# Patient Record
Sex: Female | Born: 1955 | Race: White | Hispanic: No | Smoking: Never smoker
Health system: Southern US, Community
[De-identification: ages and names within clinical notes are randomized; demographics above are authoritative.]

## PROBLEM LIST (undated history)

## (undated) DIAGNOSIS — I10 Essential (primary) hypertension: Secondary | ICD-10-CM

## (undated) DIAGNOSIS — I219 Acute myocardial infarction, unspecified: Secondary | ICD-10-CM

## (undated) DIAGNOSIS — E1159 Type 2 diabetes mellitus with other circulatory complications: Secondary | ICD-10-CM

## (undated) HISTORY — PX: CHOLECYSTECTOMY: SHX55

## (undated) HISTORY — PX: CORONARY ANGIOPLASTY WITH STENT PLACEMENT: SHX49

## (undated) HISTORY — DX: Type 2 diabetes mellitus with other circulatory complications: E11.59

## (undated) HISTORY — PX: TUBAL LIGATION: SHX77

---

## 2004-12-12 ENCOUNTER — Other Ambulatory Visit: Admission: RE | Admit: 2004-12-12 | Discharge: 2004-12-12 | Payer: Self-pay | Admitting: Otolaryngology

## 2004-12-15 ENCOUNTER — Encounter: Admission: RE | Admit: 2004-12-15 | Discharge: 2004-12-15 | Payer: Self-pay | Admitting: Otolaryngology

## 2005-01-17 ENCOUNTER — Encounter: Admission: RE | Admit: 2005-01-17 | Discharge: 2005-01-17 | Payer: Self-pay | Admitting: Otolaryngology

## 2005-01-22 ENCOUNTER — Ambulatory Visit (HOSPITAL_COMMUNITY): Admission: RE | Admit: 2005-01-22 | Discharge: 2005-01-22 | Payer: Self-pay | Admitting: Otolaryngology

## 2005-01-22 ENCOUNTER — Ambulatory Visit (HOSPITAL_BASED_OUTPATIENT_CLINIC_OR_DEPARTMENT_OTHER): Admission: RE | Admit: 2005-01-22 | Discharge: 2005-01-22 | Payer: Self-pay | Admitting: Otolaryngology

## 2005-03-29 ENCOUNTER — Encounter: Admission: RE | Admit: 2005-03-29 | Discharge: 2005-03-29 | Payer: Self-pay | Admitting: Otolaryngology

## 2005-04-02 ENCOUNTER — Ambulatory Visit (HOSPITAL_COMMUNITY): Admission: RE | Admit: 2005-04-02 | Discharge: 2005-04-02 | Payer: Self-pay | Admitting: Otolaryngology

## 2005-04-02 ENCOUNTER — Ambulatory Visit (HOSPITAL_BASED_OUTPATIENT_CLINIC_OR_DEPARTMENT_OTHER): Admission: RE | Admit: 2005-04-02 | Discharge: 2005-04-02 | Payer: Self-pay | Admitting: Otolaryngology

## 2016-07-27 ENCOUNTER — Emergency Department (HOSPITAL_COMMUNITY): Payer: 59

## 2016-07-27 ENCOUNTER — Encounter (HOSPITAL_COMMUNITY): Payer: Self-pay

## 2016-07-27 ENCOUNTER — Emergency Department (HOSPITAL_COMMUNITY)
Admission: EM | Admit: 2016-07-27 | Discharge: 2016-07-27 | Disposition: A | Discharge status: Home / Self Care | Payer: 59 | Attending: Emergency Medicine | Admitting: Emergency Medicine

## 2016-07-27 DIAGNOSIS — Z91040 Latex allergy status: Secondary | ICD-10-CM | POA: Insufficient documentation

## 2016-07-27 DIAGNOSIS — I252 Old myocardial infarction: Secondary | ICD-10-CM | POA: Insufficient documentation

## 2016-07-27 DIAGNOSIS — I10 Essential (primary) hypertension: Secondary | ICD-10-CM | POA: Diagnosis not present

## 2016-07-27 DIAGNOSIS — R109 Unspecified abdominal pain: Secondary | ICD-10-CM

## 2016-07-27 DIAGNOSIS — Z955 Presence of coronary angioplasty implant and graft: Secondary | ICD-10-CM | POA: Diagnosis not present

## 2016-07-27 DIAGNOSIS — E119 Type 2 diabetes mellitus without complications: Secondary | ICD-10-CM | POA: Diagnosis not present

## 2016-07-27 DIAGNOSIS — N200 Calculus of kidney: Secondary | ICD-10-CM | POA: Diagnosis not present

## 2016-07-27 HISTORY — DX: Acute myocardial infarction, unspecified: I21.9

## 2016-07-27 HISTORY — DX: Essential (primary) hypertension: I10

## 2016-07-27 LAB — BASIC METABOLIC PANEL
ANION GAP: 11 (ref 5–15)
BUN: 18 mg/dL (ref 6–20)
CALCIUM: 10.2 mg/dL (ref 8.9–10.3)
CHLORIDE: 104 mmol/L (ref 101–111)
CO2: 23 mmol/L (ref 22–32)
CREATININE: 1 mg/dL (ref 0.44–1.00)
GFR calc non Af Amer: >60 mL/min (ref >60)
GLUCOSE: 328 mg/dL — AB (ref 65–99)
POTASSIUM: 4.7 mmol/L (ref 3.5–5.1)
SODIUM: 138 mmol/L (ref 135–145)

## 2016-07-27 LAB — URINALYSIS, ROUTINE W REFLEX MICROSCOPIC
BILIRUBIN URINE: NEGATIVE
Ketones, ur: NEGATIVE mg/dL
Leukocytes, UA: NEGATIVE
Nitrite: NEGATIVE
PROTEIN: NEGATIVE mg/dL
SPECIFIC GRAVITY, URINE: 1.034 — AB (ref 1.005–1.030)
pH: 5 (ref 5.0–8.0)

## 2016-07-27 LAB — CBC WITH DIFFERENTIAL/PLATELET
BASOS ABS: 0 10*3/uL (ref 0.0–0.1)
Basophils Relative: 0 %
EOS ABS: 0.1 10*3/uL (ref 0.0–0.7)
Eosinophils Relative: 2 %
HCT: 37.9 % (ref 36.0–46.0)
HEMOGLOBIN: 12.7 g/dL (ref 12.0–15.0)
LYMPHS ABS: 2.1 10*3/uL (ref 0.7–4.0)
Lymphocytes Relative: 41 %
MCH: 30 pg (ref 26.0–34.0)
MCHC: 33.5 g/dL (ref 30.0–36.0)
MCV: 89.4 fL (ref 78.0–100.0)
Monocytes Absolute: 0.2 10*3/uL (ref 0.1–1.0)
Monocytes Relative: 3 %
NEUTROS PCT: 54 %
Neutro Abs: 2.7 10*3/uL (ref 1.7–7.7)
PLATELETS: 297 10*3/uL (ref 150–400)
RBC: 4.24 MIL/uL (ref 3.87–5.11)
RDW: 12.8 % (ref 11.5–15.5)
WBC: 5 10*3/uL (ref 4.0–10.5)

## 2016-07-27 LAB — URINE MICROSCOPIC-ADD ON

## 2016-07-27 LAB — CBG MONITORING, ED: GLUCOSE-CAPILLARY: 315 mg/dL — AB (ref 65–99)

## 2016-07-27 MED ORDER — HYDROMORPHONE HCL 1 MG/ML IJ SOLN
1.0000 mg | Freq: Once | INTRAMUSCULAR | Status: AC
  Administered 2016-07-27: 1 mg via INTRAVENOUS
  Filled 2016-07-27: qty 1

## 2016-07-27 MED ORDER — HYDROCODONE-ACETAMINOPHEN 5-325 MG PO TABS: 1.0000 | ORAL_TABLET | Freq: Four times a day (QID) | ORAL | 0 refills | Status: DC | PRN

## 2016-07-27 MED ORDER — FENTANYL CITRATE (PF) 100 MCG/2ML IJ SOLN: 50.0000 ug | INTRAMUSCULAR | Status: DC | PRN

## 2016-07-27 MED ORDER — ONDANSETRON HCL 4 MG/2ML IJ SOLN
4.0000 mg | Freq: Once | INTRAMUSCULAR | Status: AC
  Administered 2016-07-27: 4 mg via INTRAVENOUS
  Filled 2016-07-27: qty 2

## 2016-07-27 NOTE — ED Notes (Signed)
Pt is aware of high blood pressure. Pt has not been able to take meds due to emesis.

## 2016-07-27 NOTE — ED Notes (Signed)
Patient transported to X-ray 

## 2016-07-27 NOTE — ED Notes (Signed)
Patient transported to CT 

## 2016-07-27 NOTE — ED Notes (Signed)
Return from xray

## 2016-07-27 NOTE — ED Provider Notes (Signed)
[facility]-EMERGENCY DEPT Provider Note   CSN: [id number] Arrival date & time: 07/27/16  0945     History   Chief Complaint Chief Complaint  Patient presents with  . Flank Pain  . Shortness of Breath    HPI Alyssa Fox is a 60 y.o. female.  Patient with past medical history of diabetes, hypertension, prior MI presents to the emergency department with chief complaint of right flank pain. She states pain started suddenly at around 7:30 this morning.  She states the pain was followed by some nausea, vomiting, diaphoresis. She states the pain takes her breath away. She denies any chest pain. She denies any numbness, weakness, or tingling of her extremities.  She has not tried taking anything for her symptoms. She denies any dysuria or hematuria.   The history is provided by the patient. No language interpreter was used.    Past Medical History:  Diagnosis Date  . Diabetes mellitus without complication (HCC)   . Hypertension   . MI (myocardial infarction) (HCC)     There are no active problems to display for this patient.   Past Surgical History:  Procedure Laterality Date  . CHOLECYSTECTOMY    . CORONARY ANGIOPLASTY WITH STENT PLACEMENT    . TUBAL LIGATION      OB History    No data available       Home Medications    Prior to Admission medications   Not on File    Family History No family history on file.  Social History Social History  Substance Use Topics  . Smoking status: Never Smoker  . Smokeless tobacco: Never Used  . Alcohol use No     Allergies   Latex   Review of Systems Review of Systems  Constitutional: Positive for diaphoresis.  Gastrointestinal: Positive for nausea.  Genitourinary: Positive for flank pain.  All other systems reviewed and are negative.    Physical Exam Updated Vital Signs BP (!) 174/125 (BP Location: Right Arm)   Pulse (!) 56   Temp 97.3 F (36.3 C) (Oral)   Resp 22   Wt 110.2 kg   SpO2 99%   Physical  Exam  Constitutional: She is oriented to person, place, and time. She appears well-developed and well-nourished.  HENT:  Head: Normocephalic and atraumatic.  Eyes: Conjunctivae and EOM are normal. Pupils are equal, round, and reactive to light.  Neck: Normal range of motion. Neck supple.  Cardiovascular: Normal rate, regular rhythm and intact distal pulses.  Exam reveals no gallop and no friction rub.   No murmur heard. Intact distal pulses bilaterally  Pulmonary/Chest: Effort normal and breath sounds normal. No respiratory distress. She has no wheezes. She has no rales. She exhibits no tenderness.  Abdominal: Soft. Bowel sounds are normal. She exhibits no distension and no mass. There is no tenderness. There is no rebound and no guarding.  Right-sided CVA tenderness No focal abdominal tenderness, no RLQ tenderness or pain at McBurney's point, no RUQ tenderness or Murphy's sign, no left-sided abdominal tenderness, no fluid wave, or signs of peritonitis   Musculoskeletal: Normal range of motion. She exhibits no edema or tenderness.  Neurological: She is alert and oriented to person, place, and time.  Moves all extremities Sensation and strength intact throughout  Skin: Skin is warm and dry.  Psychiatric: She has a normal mood and affect. Her behavior is normal. Judgment and thought content normal.  Nursing note and vitals reviewed.    ED Treatments / Results  Labs (  all labs ordered are listed, but only abnormal results are displayed) Labs Reviewed  URINALYSIS, ROUTINE W REFLEX MICROSCOPIC (NOT AT [facility]) - Abnormal; Notable for the following:       Result Value   Specific Gravity, Urine 1.034 (*)    Glucose, UA >1000 (*)    Hgb urine dipstick SMALL (*)    All other components within normal limits  URINE MICROSCOPIC-ADD ON - Abnormal; Notable for the following:    Squamous Epithelial / LPF 0-5 (*)    Bacteria, UA FEW (*)    Crystals URIC ACID CRYSTALS (*)    All other components  within normal limits  CBG MONITORING, ED - Abnormal; Notable for the following:    Glucose-Capillary 315 (*)    All other components within normal limits    EKG  EKG Interpretation None       Radiology No results found.  Procedures Procedures (including critical care time)  Medications Ordered in ED Medications  fentaNYL (SUBLIMAZE) injection 50 mcg (not administered)  HYDROmorphone (DILAUDID) injection 1 mg (not administered)     Initial Impression / Assessment and Plan / ED Course  I have reviewed the triage vital signs and the nursing notes.  Pertinent labs & imaging results that were available during my care of the patient were reviewed by me and considered in my medical decision making (see chart for details).  Clinical Course    Patient with sudden onset right flank pain. Associated nausea, vomiting, and diaphoresis. She does have some blood in her urine. Suspicious of kidney stone. Will check CT renal stone study. She is neurovascularly intact. Will treat pain and will reassess.  CT findings are consistent with recently passed stone. Her urine is also consistent with stone. I did discuss with the patient there is possibility though unlikely, that there is a stone not visualized on the CT scan. I will prescribe person pain medicine and give her urine strainer. Her pain is adequately controlled now. She feels comfortable with going home. Will recommend primary care follow-up. Return precautions have been given. She is stable and ready for discharge.  Final Clinical Impressions(s) / ED Diagnoses   Final diagnoses:  Flank pain  Kidney stone    New Prescriptions New Prescriptions   HYDROCODONE-ACETAMINOPHEN (NORCO/VICODIN) 5-325 MG TABLET    Take 1-2 tablets by mouth every 6 (six) hours as needed.     Roxy HorsemanRobert Mahogony Gilchrest, PA-C 07/27/16 1321    Nira ConnPedro Eduardo Cardama, MD 07/28/16 1329

## 2016-07-27 NOTE — ED Triage Notes (Signed)
Per Pt, Pt is coming from home with complaints of right lower back pain that started around 0730. Followed by SOB, Diaphoresis, and nausea. Pt denies Hx of the same. Alert and oriented x4. Denies Chest pain.

## 2017-09-08 IMAGING — CT CT RENAL STONE PROTOCOL
2 of 4 series · 16 of 46 positions shown, 18 images · non-contrast
Comparison: None.

CLINICAL DATA: Right flank and low back pain with nausea beginning
this morning.

EXAM:
CT ABDOMEN AND PELVIS WITHOUT CONTRAST
TECHNIQUE: Multidetector CT imaging of the abdomen and pelvis was performed
following the standard protocol without IV contrast.

[Series 2: renal stone 5mm · axial · 0.98mm/px · z∈[-524,-59]mm · 13 of 103 slices shown, 15 images]
[im 5/103  soft-tissue]
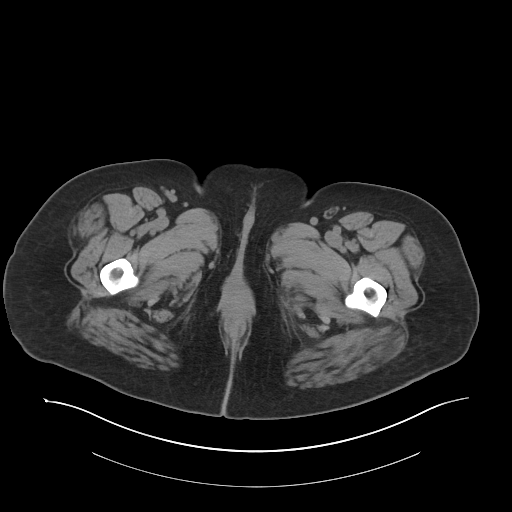
[im 5/103  bone]
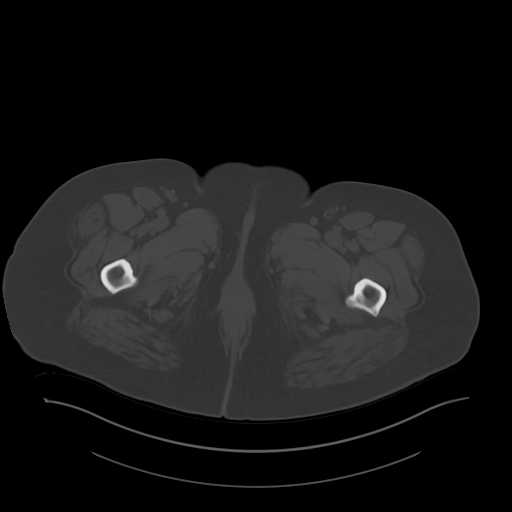
[im 14/103  soft-tissue]
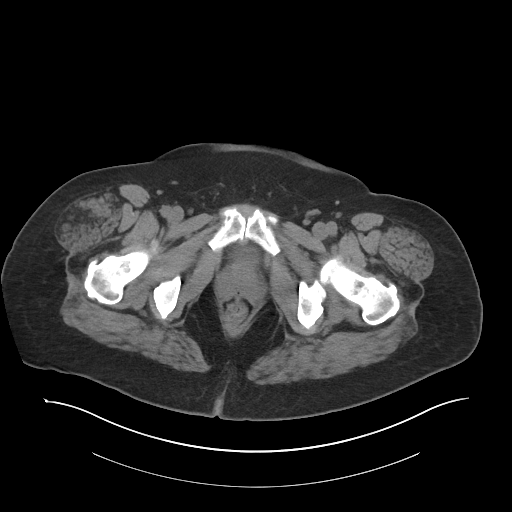
[im 23/103  soft-tissue]
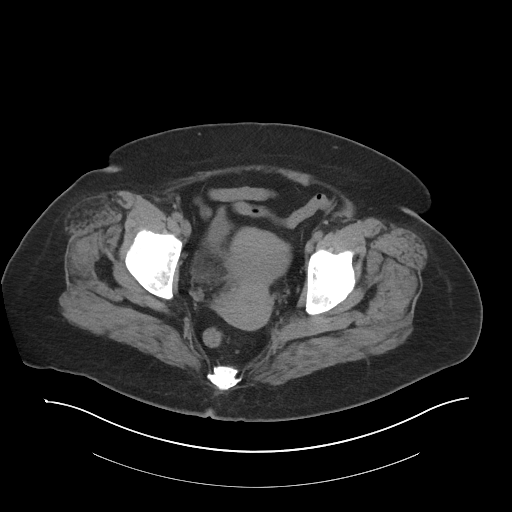
[im 27/103  soft-tissue]
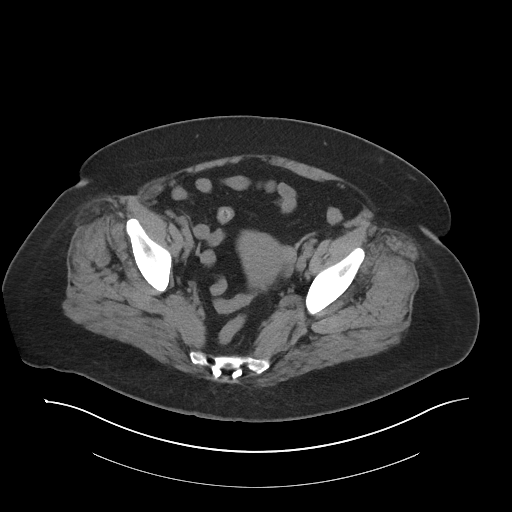
[im 36/103  soft-tissue]
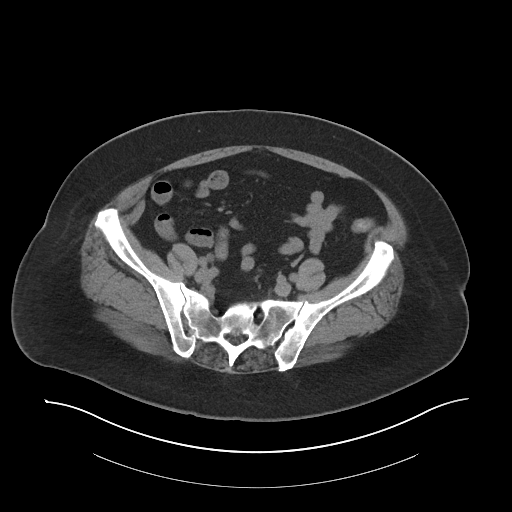
[im 45/103  soft-tissue]
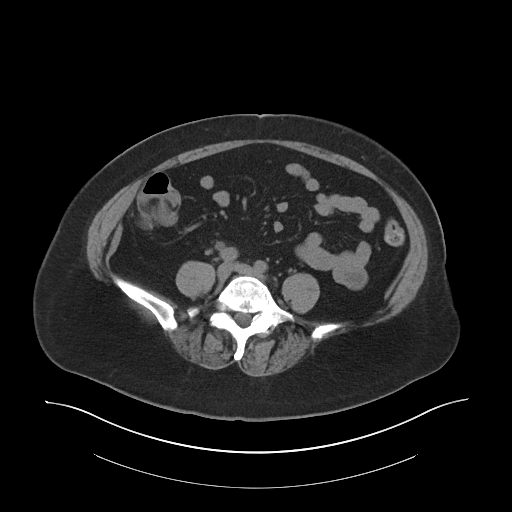
[im 54/103  soft-tissue]
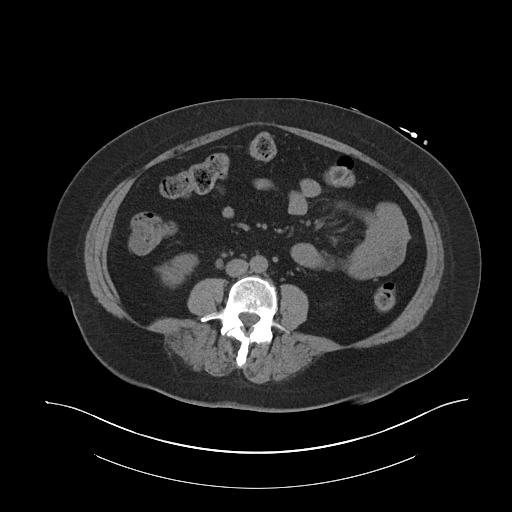
[im 58/103  soft-tissue]
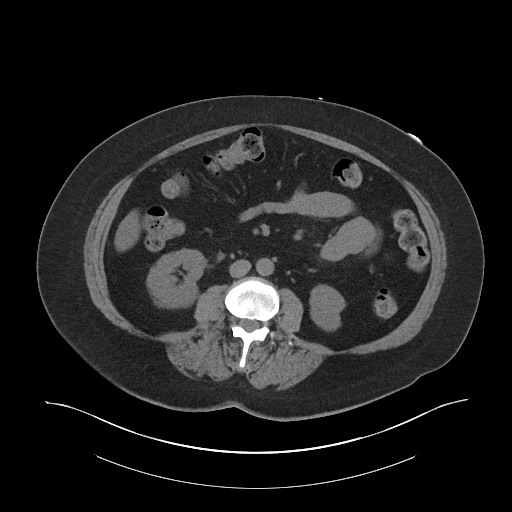
[im 67/103  soft-tissue]
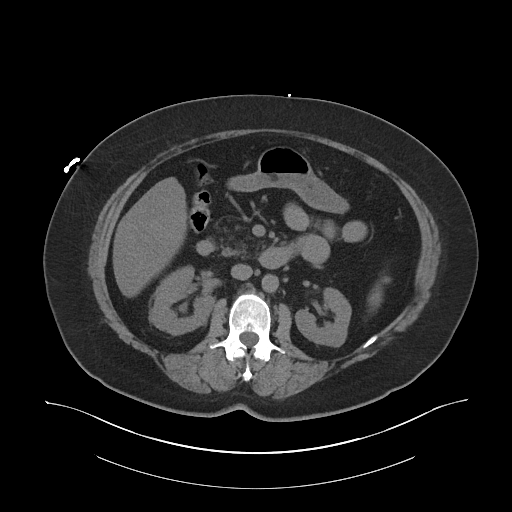
[im 67/103  bone]
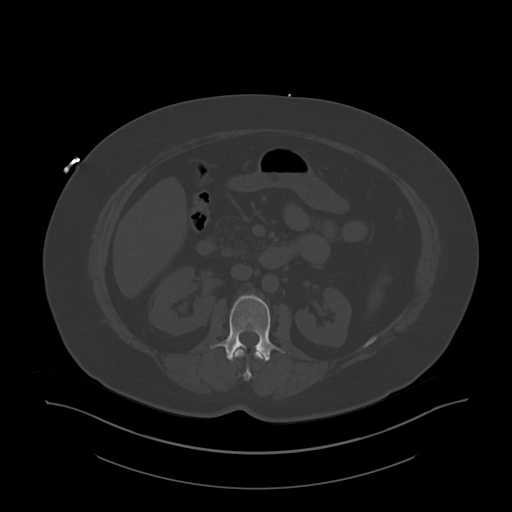
[im 76/103  soft-tissue]
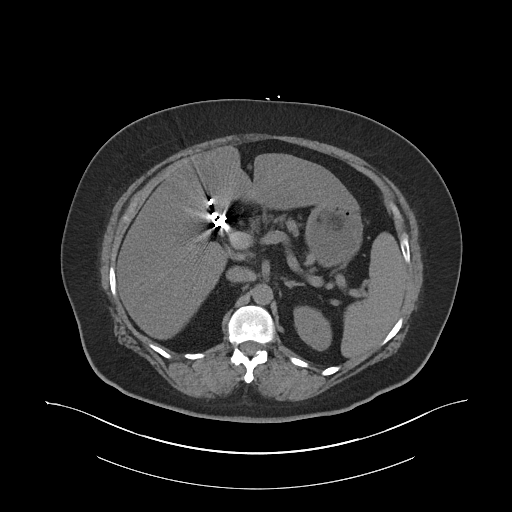
[im 80/103  soft-tissue]
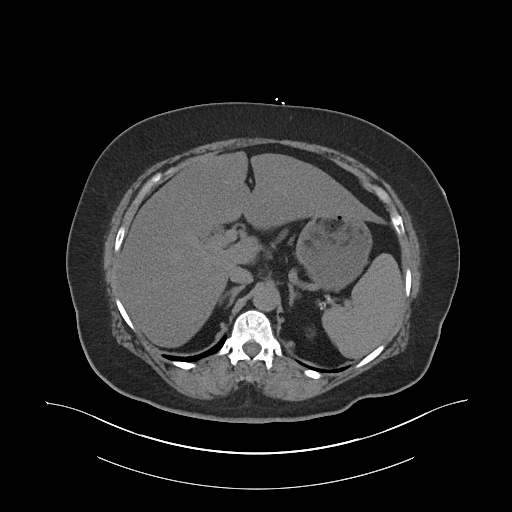
[im 89/103  soft-tissue]
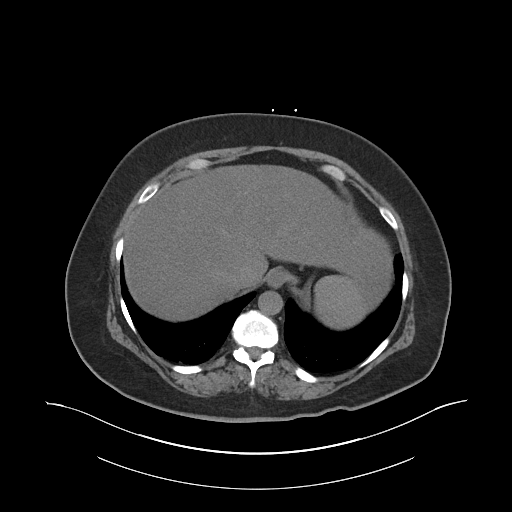
[im 98/103  soft-tissue]
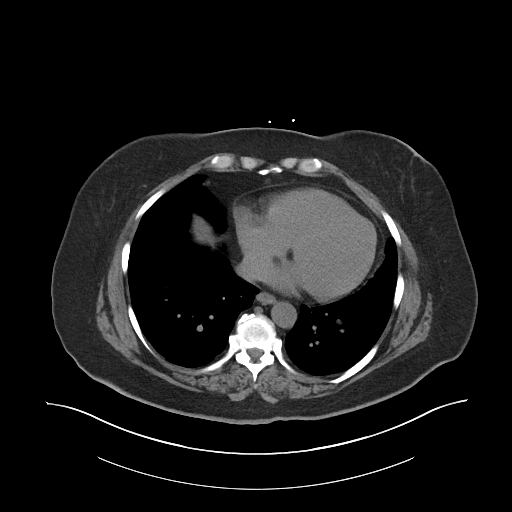

[Series 5: renal stone 3.0 cor · coronal · 0.84mm/px · 3 of 108 slices shown]
[im 36/108  soft-tissue]
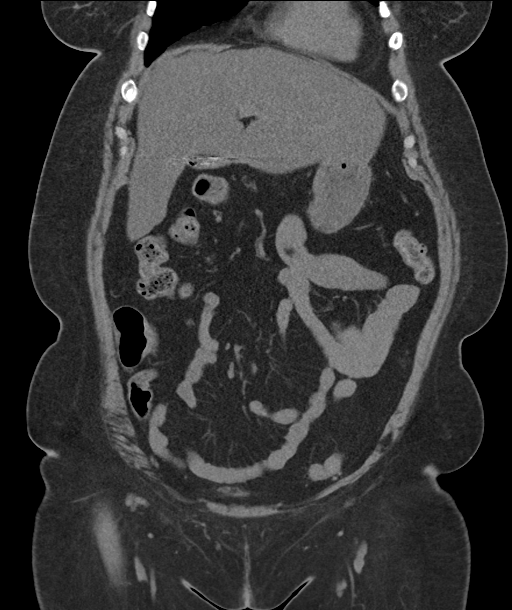
[im 48/108  soft-tissue]
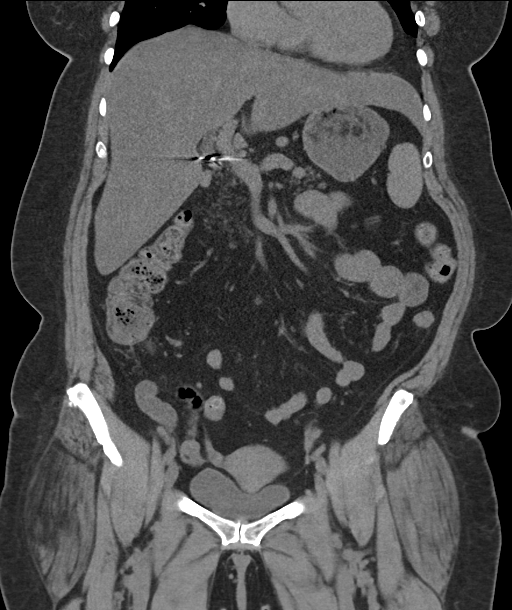
[im 60/108  soft-tissue]
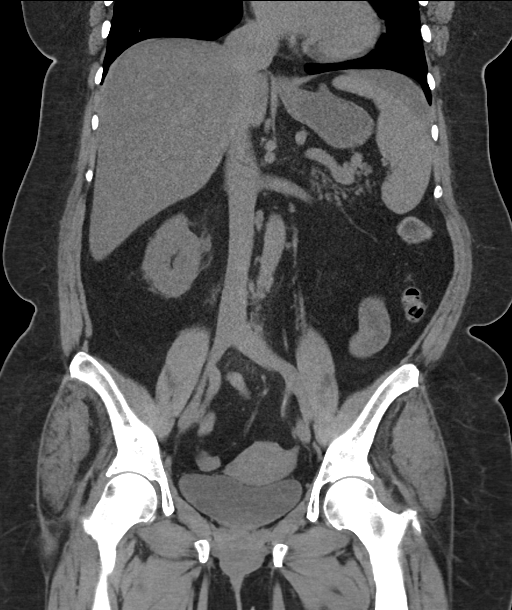

[16 of 46 positions shown; findings below may reference images not displayed]

FINDINGS: Lower chest: Lung bases show mild scarring. There is probably left
coronary artery calcifications seen on the initial image.

Hepatobiliary: No focal liver lesion. Insignificant few scattered
calcified granulomas. Previous cholecystectomy. There may be mild
diffuse fatty change of the liver.

Pancreas: Fatty change of the pancreas without focal lesion.

Spleen: Normal

Adrenals/Urinary Tract: Normal adrenal glands. Left kidney is normal
without cyst, mass, stone or hydronephrosis. Right kidney shows mild
swelling. Nonobstructing 2 mm stone in the midportion. Mild fullness
of the renal collecting system an ureter. No stone seen presently
the within the ureter or within the bladder. There is a vascular
calcification on image 75 that is lateral to the ureter.

Stomach/Bowel: Normal.  Appendix is normal.

Vascular/Lymphatic: Atherosclerosis of the aorta. IVC is normal. No
retroperitoneal mass or lymphadenopathy.

Reproductive: Uterus and adnexal regions appear normal.

Other: None.

Musculoskeletal:  Degenerative disc disease L5-S1.
IMPRESSION: Mild fullness of the right renal collecting system and ureter, but
no visible ureteral stone or bladder stone at this time. Perhaps
this stone has passed.

2 mm nonobstructing stone in the midportion of the right kidney.

Mild fatty change of the liver.

Aortic atherosclerosis.

## 2018-01-31 ENCOUNTER — Encounter: Payer: Self-pay | Admitting: "Endocrinology

## 2018-03-18 LAB — HEMOGLOBIN A1C: Hemoglobin A1C: 9

## 2018-04-17 ENCOUNTER — Ambulatory Visit (INDEPENDENT_AMBULATORY_CARE_PROVIDER_SITE_OTHER): Payer: 59 | Admitting: "Endocrinology

## 2018-04-17 ENCOUNTER — Encounter: Payer: Self-pay | Admitting: "Endocrinology

## 2018-04-17 VITALS — BP 136/74 | HR 80 | Ht 65.0 in | Wt 240.0 lb

## 2018-04-17 DIAGNOSIS — E1122 Type 2 diabetes mellitus with diabetic chronic kidney disease: Secondary | ICD-10-CM | POA: Diagnosis not present

## 2018-04-17 DIAGNOSIS — Z794 Long term (current) use of insulin: Secondary | ICD-10-CM

## 2018-04-17 DIAGNOSIS — E1159 Type 2 diabetes mellitus with other circulatory complications: Secondary | ICD-10-CM

## 2018-04-17 DIAGNOSIS — N183 Chronic kidney disease, stage 3 unspecified: Secondary | ICD-10-CM

## 2018-04-17 DIAGNOSIS — I10 Essential (primary) hypertension: Secondary | ICD-10-CM

## 2018-04-17 DIAGNOSIS — E782 Mixed hyperlipidemia: Secondary | ICD-10-CM | POA: Diagnosis not present

## 2018-04-17 DIAGNOSIS — I219 Acute myocardial infarction, unspecified: Secondary | ICD-10-CM | POA: Diagnosis not present

## 2018-04-17 NOTE — Progress Notes (Signed)
Endocrinology Consult Note       04/17/2018, 5:00 PM   Subjective:    [name], female    DOB: 03-10-1956.  Alyssa Fox is being seen in consultation for management of currently uncontrolled symptomatic diabetes requested by  Jonetta Speak, MD.   Past Medical History:  Diagnosis Date  . Diabetes mellitus without complication (HCC)   . Hypertension   . MI (myocardial infarction) [facility])    Past Surgical History:  Procedure Laterality Date  . CHOLECYSTECTOMY    . CORONARY ANGIOPLASTY WITH STENT PLACEMENT    . TUBAL LIGATION     Social History   Socioeconomic History  . Marital status: Married    Spouse name: Not on file  . Number of children: Not on file  . Years of education: Not on file  . Highest education level: Not on file  Occupational History  . Not on file  Social Needs  . Financial resource strain: Not on file  . Food insecurity:    Worry: Not on file    Inability: Not on file  . Transportation needs:    Medical: Not on file    Non-medical: Not on file  Tobacco Use  . Smoking status: Never Smoker  . Smokeless tobacco: Never Used  Substance and Sexual Activity  . Alcohol use: No  . Drug use: No  . Sexual activity: Not on file  Lifestyle  . Physical activity:    Days per week: Not on file    Minutes per session: Not on file  . Stress: Not on file  Relationships  . Social connections:    Talks on phone: Not on file    Gets together: Not on file    Attends religious service: Not on file    Active member of club or organization: Not on file    Attends meetings of clubs or organizations: Not on file    Relationship status: Not on file  Other Topics Concern  . Not on file  Social History Narrative  . Not on file   Outpatient Encounter Medications as of 04/17/2018  Medication Sig  . allopurinol (ZYLOPRIM) 100 MG tablet every morning.  Marland Kitchen  aspirin EC 81 MG tablet Take by mouth daily.  Marland Kitchen atorvastatin (LIPITOR) 40 MG tablet Take 40 mg by mouth daily.  Marland Kitchen gabapentin (NEURONTIN) 100 MG capsule Take 100 mg by mouth 3 (three) times daily.  . Insulin Glargine (BASAGLAR KWIKPEN) 100 UNIT/ML SOPN Inject 40 Units into the skin at bedtime.  . liraglutide (VICTOZA) 18 MG/3ML SOPN Inject 1.8 mg into the skin at bedtime.  Marland Kitchen lisinopril (PRINIVIL,ZESTRIL) 5 MG tablet Take 5 mg by mouth daily.  . meloxicam (MOBIC) 15 MG tablet Take 15 mg by mouth daily.  . metFORMIN (GLUCOPHAGE-XR) 750 MG 24 hr tablet Take 750 mg by mouth daily with breakfast.  . nebivolol (BYSTOLIC) 5 MG tablet Take 5 mg by mouth daily.  . nitroGLYCERIN (NITROSTAT) 0.4 MG SL tablet Place 0.4 mg under the tongue every 5 (five) minutes as needed for chest pain.  . ticagrelor (BRILINTA) 90 MG TABS tablet Take by  mouth 2 (two) times daily.  . [DISCONTINUED] fenofibrate 54 MG tablet Take 54 mg by mouth daily.  . [DISCONTINUED] HYDROcodone-acetaminophen (NORCO/VICODIN) 5-325 MG tablet Take 1-2 tablets by mouth every 6 (six) hours as needed.  . [DISCONTINUED] metFORMIN (GLUCOPHAGE) 500 MG tablet Take 500 mg by mouth 2 (two) times daily with a meal.  . [DISCONTINUED] nebivolol (BYSTOLIC) 5 MG tablet Take 5 mg by mouth daily.  . [DISCONTINUED] ticagrelor (BRILINTA) 90 MG TABS tablet Take 90 mg by mouth 2 (two) times daily.   No facility-administered encounter medications on file as of 04/17/2018.     ALLERGIES: Allergies  Allergen Reactions  . Latex     VACCINATION STATUS:  There is no immunization history on file for this patient.  Diabetes  She presents for her initial diabetic visit. She has type 2 diabetes mellitus. Onset time: She was diagnosed at approximate age of 55 years. Her disease course has been worsening. There are no hypoglycemic associated symptoms. Pertinent negatives for hypoglycemia include no confusion, headaches, pallor or seizures. There are no diabetic  associated symptoms. Pertinent negatives for diabetes include no chest pain, no polydipsia, no polyphagia and no polyuria. There are no hypoglycemic complications. Symptoms are improving. Diabetic complications include heart disease. (She is status post 3 stent placement for coronary artery disease and December 2015-about the time of her diagnosis with diabetes.) Risk factors for coronary artery disease include dyslipidemia, diabetes mellitus, family history, obesity, hypertension, post-menopausal and sedentary lifestyle. Current diabetic treatment includes insulin injections and oral agent (monotherapy) (She is currently on Basaglar 40 units nightly, Victoza 1.2 mg daily, and metformin 750 mg p.o. daily.). Her weight is fluctuating minimally. She is following a generally unhealthy diet. When asked about meal planning, she reported none. She has not had a previous visit with a dietitian. She rarely participates in exercise. (She did not bring any meter nor logs to review today.  She reports that her recent A1c was 9% improving from 10+ percent.) An ACE inhibitor/angiotensin II receptor blocker is being taken. Eye exam is current.  Hyperlipidemia  This is a chronic problem. The current episode started more than 1 year ago. Exacerbating diseases include diabetes and obesity. Pertinent negatives include no chest pain, myalgias or shortness of breath. Current antihyperlipidemic treatment includes statins. Risk factors for coronary artery disease include hypertension, dyslipidemia, a sedentary lifestyle, post-menopausal, diabetes mellitus and obesity.  Hypertension  This is a chronic problem. The current episode started more than 1 year ago. Pertinent negatives include no chest pain, headaches, palpitations or shortness of breath. Risk factors for coronary artery disease include dyslipidemia, diabetes mellitus, family history, post-menopausal state, obesity and sedentary lifestyle. Past treatments include ACE  inhibitors.      Review of Systems  Constitutional: Negative for chills, fever and unexpected weight change.  HENT: Negative for trouble swallowing and voice change.   Eyes: Negative for visual disturbance.  Respiratory: Negative for cough, shortness of breath and wheezing.   Cardiovascular: Negative for chest pain, palpitations and leg swelling.  Gastrointestinal: Negative for diarrhea, nausea and vomiting.  Endocrine: Negative for cold intolerance, heat intolerance, polydipsia, polyphagia and polyuria.  Musculoskeletal: Negative for arthralgias and myalgias.  Skin: Negative for color change, pallor, rash and wound.  Neurological: Negative for seizures and headaches.  Psychiatric/Behavioral: Negative for confusion and suicidal ideas.    Objective:    BP 136/74   Pulse 80   Ht 5\' 5"  (1.651 m)   Wt 240 lb (108.9 kg)   BMI 39.94  kg/m   Wt Readings from Last 3 Encounters:  04/17/18 240 lb (108.9 kg)  07/27/16 243 lb (110.2 kg)     Physical Exam  Constitutional: She is oriented to person, place, and time. She appears well-developed.  HENT:  Head: Normocephalic and atraumatic.  Eyes: EOM are normal.  Neck: Normal range of motion. Neck supple. No tracheal deviation present. No thyromegaly present.  Cardiovascular: Normal rate and regular rhythm.  Pulmonary/Chest: Effort normal and breath sounds normal.  Abdominal: Soft. Bowel sounds are normal. There is no tenderness. There is no guarding.  Musculoskeletal: Normal range of motion. She exhibits no edema.  Neurological: She is alert and oriented to person, place, and time. She has normal reflexes. No cranial nerve deficit. Coordination normal.  Skin: Skin is warm and dry. No rash noted. No erythema. No pallor.  Psychiatric: She has a normal mood and affect. Judgment normal.    CMP     Component Value Date/Time   NA 138 07/27/2016 1005   K 4.7 07/27/2016 1005   CL 104 07/27/2016 1005   CO2 23 07/27/2016 1005   GLUCOSE 328  (H) 07/27/2016 1005   BUN 18 07/27/2016 1005   CREATININE 1.00 07/27/2016 1005   CALCIUM 10.2 07/27/2016 1005   GFRNONAA >60 07/27/2016 1005   GFRAA >60 07/27/2016 1005   April 2019 she reports A1c was 9%.      Assessment & Plan:   1. Type 2 diabetes mellitus with stage 3 chronic kidney disease, with long-term current use of insulin (HCC)   - Alyssa Fox has currently uncontrolled symptomatic type 2 DM since 62 years of age,  with most recent reported A1c of 9 %. Recent labs reviewed, stage 3 renal insufficiency.  -her diabetes is complicated by coronary artery disease which required stent placement in 2015, renal insufficiency, obesity/sedentary life and Alyssa Fox remains at a high risk for more acute and chronic complications which include CAD, CVA, CKD, retinopathy, and neuropathy. These are all discussed in detail with the patient.  - I have counseled her on diet management and weight loss, by adopting a carbohydrate restricted/protein rich diet.  - Suggestion is made for her to avoid simple carbohydrates  from her diet including Cakes, Sweet Desserts, Ice Cream, Soda (diet and regular), Sweet Tea, Candies, Chips, Cookies, Store Bought Juices, Alcohol in Excess of  1-2 drinks a day, Artificial Sweeteners, and "Sugar-free" Products. This will help patient to have stable blood glucose profile and potentially avoid unintended weight gain.  - I encouraged her to switch to  unprocessed or minimally processed complex starch and increased protein intake (animal or plant source), fruits, and vegetables.  - she is advised to stick to a routine mealtimes to eat 3 meals  a day and avoid unnecessary snacks ( to snack only to correct hypoglycemia).   - she will be scheduled with Alyssa Fox, RDN, CDE for individualized diabetes education.  - I have approached her with the following individualized plan to manage diabetes and patient agrees:   -Based on her current glycemic  burden, she may require intensive treatment with basal/bolus insulin.   -In preparation, I approach her to start monitoring blood glucose 4 times a day-before meals and at bedtime and return in 1 week with her meter and logs for reevaluation.   -In the meantime, I advised her to continue Basaglar 40 units nightly, increase Victoza to 1.8 mg of cutaneously daily, continue metformin 750 mg ER daily after breakfast.   -  Patient is warned not to take insulin without proper monitoring per orders.  -Patient is encouraged to call clinic for blood glucose levels less than 70 or above 300 mg /dl.  -Patient is not a candidate for SGLT2 inhibitors, nor full dose of metformin due to CKD.  - she will be considered for incretin therapy as appropriate next visit. - Patient specific target  A1c;  LDL, HDL, Triglycerides, and  Waist Circumference were discussed in detail.  2) BP/HTN: Her blood pressure is controlled to target.  She is advised to continue her current blood pressure medications including lisinopril 5 mg p.o. daily.   3) Lipids/HPL:   He does not have recent lipid panel to review.  She is advised to continue atorvastatin 40 mg p.o. nightly.  4)  Weight/Diet: CDE Consult will be initiated , exercise, and detailed carbohydrates information provided.  She may benefit from weight loss surgery, which is briefly discussed with her.  This will be explored on subsequent visits.  5) Chronic Care/Health Maintenance:  -she  is on ACEI/ARB and Statin medications and  is encouraged to continue to follow up with Ophthalmology, Dentist,  Podiatrist at least yearly or according to recommendations, and advised to  stay away from smoking. I have recommended yearly flu vaccine and pneumonia vaccination at least every 5 years; moderate intensity exercise for up to 150 minutes weekly; and  sleep for at least 7 hours a day.  - I advised patient to maintain close follow up with Jonetta Speak, MD for primary care  needs.  - Time spent with the patient: 45 minutes, of which >50% was spent in obtaining information about her symptoms, reviewing her previous labs, evaluations, and treatments, counseling her about her currently uncontrolled, complicated type 2 diabetes; obesity, hypertension, hyperlipidemia, and developing a plan to confirm the diagnosis and long term treatment as necessary.  Alyssa Fox participated in the discussions, expressed understanding, and voiced agreement with the above plans.  All questions were answered to her satisfaction. she is encouraged to contact clinic should she have any questions or concerns prior to her return visit.  Follow up plan: - Return in about 1 week (around 04/24/2018) for follow up with meter and logs- no labs.  Alyssa Lunch, MD [facility] [address] [place], [place] [address] Phone: [phone]  Fax: [phone]    04/17/2018, 5:00 PM  This note was partially dictated with voice recognition software. Similar sounding words can be transcribed inadequately or may not  be corrected upon review.

## 2018-04-17 NOTE — Patient Instructions (Signed)

## 2018-04-30 ENCOUNTER — Ambulatory Visit (INDEPENDENT_AMBULATORY_CARE_PROVIDER_SITE_OTHER): Payer: 59 | Admitting: "Endocrinology

## 2018-04-30 ENCOUNTER — Encounter: Payer: Self-pay | Admitting: "Endocrinology

## 2018-04-30 VITALS — BP 117/77 | HR 89 | Ht 65.0 in | Wt 242.0 lb

## 2018-04-30 DIAGNOSIS — Z794 Long term (current) use of insulin: Secondary | ICD-10-CM

## 2018-04-30 DIAGNOSIS — E782 Mixed hyperlipidemia: Secondary | ICD-10-CM

## 2018-04-30 DIAGNOSIS — E1122 Type 2 diabetes mellitus with diabetic chronic kidney disease: Secondary | ICD-10-CM | POA: Diagnosis not present

## 2018-04-30 DIAGNOSIS — N183 Chronic kidney disease, stage 3 unspecified: Secondary | ICD-10-CM

## 2018-04-30 DIAGNOSIS — I10 Essential (primary) hypertension: Secondary | ICD-10-CM

## 2018-04-30 NOTE — Patient Instructions (Signed)

## 2018-04-30 NOTE — Progress Notes (Signed)
Endocrinology Consult Note       04/30/2018, 5:50 PM   Subjective:    [name], female    DOB: 01/12/1956.  Alyssa Fox is being seen in consultation for management of currently uncontrolled symptomatic diabetes requested by  Jonetta Speak, MD.   Past Medical History:  Diagnosis Date  . DM type 2 causing vascular disease (HCC)   . Hypertension   . MI (myocardial infarction) [facility])    Past Surgical History:  Procedure Laterality Date  . CHOLECYSTECTOMY    . CORONARY ANGIOPLASTY WITH STENT PLACEMENT    . TUBAL LIGATION     Social History   Socioeconomic History  . Marital status: Married    Spouse name: Not on file  . Number of children: Not on file  . Years of education: Not on file  . Highest education level: Not on file  Occupational History  . Not on file  Social Needs  . Financial resource strain: Not on file  . Food insecurity:    Worry: Not on file    Inability: Not on file  . Transportation needs:    Medical: Not on file    Non-medical: Not on file  Tobacco Use  . Smoking status: Never Smoker  . Smokeless tobacco: Never Used  Substance and Sexual Activity  . Alcohol use: No  . Drug use: No  . Sexual activity: Not on file  Lifestyle  . Physical activity:    Days per week: Not on file    Minutes per session: Not on file  . Stress: Not on file  Relationships  . Social connections:    Talks on phone: Not on file    Gets together: Not on file    Attends religious service: Not on file    Active member of club or organization: Not on file    Attends meetings of clubs or organizations: Not on file    Relationship status: Not on file  Other Topics Concern  . Not on file  Social History Narrative  . Not on file   Outpatient Encounter Medications as of 04/30/2018  Medication Sig  . allopurinol (ZYLOPRIM) 100 MG tablet every morning.  Marland Kitchen aspirin EC  81 MG tablet Take by mouth daily.  Marland Kitchen atorvastatin (LIPITOR) 40 MG tablet Take 40 mg by mouth daily.  Marland Kitchen gabapentin (NEURONTIN) 100 MG capsule Take 100 mg by mouth 3 (three) times daily.  . Insulin Glargine (BASAGLAR KWIKPEN) 100 UNIT/ML SOPN Inject 60 Units into the skin at bedtime.  . liraglutide (VICTOZA) 18 MG/3ML SOPN Inject 1.8 mg into the skin at bedtime.  Marland Kitchen lisinopril (PRINIVIL,ZESTRIL) 5 MG tablet Take 5 mg by mouth daily.  . meloxicam (MOBIC) 15 MG tablet Take 15 mg by mouth daily.  . metFORMIN (GLUCOPHAGE-XR) 750 MG 24 hr tablet Take 750 mg by mouth daily with breakfast.  . nebivolol (BYSTOLIC) 5 MG tablet Take 5 mg by mouth daily.  . nitroGLYCERIN (NITROSTAT) 0.4 MG SL tablet Place 0.4 mg under the tongue every 5 (five) minutes as needed for chest pain.  . ticagrelor (BRILINTA) 90 MG TABS tablet  Take by mouth 2 (two) times daily.   No facility-administered encounter medications on file as of 04/30/2018.     ALLERGIES: Allergies  Allergen Reactions  . Latex     VACCINATION STATUS:  There is no immunization history on file for this patient.  Diabetes  She presents for her follow-up diabetic visit. She has type 2 diabetes mellitus. Onset time: She was diagnosed at approximate age of 29 years. Her disease course has been worsening. There are no hypoglycemic associated symptoms. Pertinent negatives for hypoglycemia include no confusion, headaches, pallor or seizures. Associated symptoms include polydipsia and polyuria. Pertinent negatives for diabetes include no chest pain and no polyphagia. There are no hypoglycemic complications. Symptoms are worsening. Diabetic complications include heart disease. (She is status post 3 stent placement for coronary artery disease and December 2015-about the time of her diagnosis with diabetes.) Risk factors for coronary artery disease include dyslipidemia, diabetes mellitus, family history, obesity, hypertension, post-menopausal and sedentary  lifestyle. Current diabetic treatment includes insulin injections and oral agent (monotherapy) (She is currently on Basaglar 40 units nightly, Victoza 1.2 mg daily, and metformin 750 mg p.o. daily.). Her weight is fluctuating minimally. She is following a generally unhealthy diet. When asked about meal planning, she reported none. She has not had a previous visit with a dietitian. She rarely participates in exercise. Her breakfast blood glucose range is generally >200 mg/dl. Her Fox blood glucose range is generally >200 mg/dl. Her dinner blood glucose range is generally >200 mg/dl. Her bedtime blood glucose range is generally >200 mg/dl. Her overall blood glucose range is >200 mg/dl. (She came with significantly above target blood glucose profile both fasting and postprandially.  She reports that her recent A1c was 9% improving from 10+ percent.) An ACE inhibitor/angiotensin II receptor blocker is being taken. Eye exam is current.  Hyperlipidemia  This is a chronic problem. The current episode started more than 1 year ago. Exacerbating diseases include diabetes and obesity. Pertinent negatives include no chest pain, myalgias or shortness of breath. Current antihyperlipidemic treatment includes statins. Risk factors for coronary artery disease include hypertension, dyslipidemia, a sedentary lifestyle, post-menopausal, diabetes mellitus and obesity.  Hypertension  This is a chronic problem. The current episode started more than 1 year ago. Pertinent negatives include no chest pain, headaches, palpitations or shortness of breath. Risk factors for coronary artery disease include dyslipidemia, diabetes mellitus, family history, post-menopausal state, obesity and sedentary lifestyle. Past treatments include ACE inhibitors.      Review of Systems  Constitutional: Negative for chills, fever and unexpected weight change.  HENT: Negative for trouble swallowing and voice change.   Eyes: Negative for visual  disturbance.  Respiratory: Negative for cough, shortness of breath and wheezing.   Cardiovascular: Negative for chest pain, palpitations and leg swelling.  Gastrointestinal: Negative for diarrhea, nausea and vomiting.  Endocrine: Positive for polydipsia and polyuria. Negative for cold intolerance, heat intolerance and polyphagia.  Musculoskeletal: Negative for arthralgias and myalgias.  Skin: Negative for color change, pallor, rash and wound.  Neurological: Negative for seizures and headaches.  Psychiatric/Behavioral: Negative for confusion and suicidal ideas.    Objective:    BP 117/77   Pulse 89   Ht 5\' 5"  (1.651 m)   Wt 242 lb (109.8 kg)   BMI 40.27 kg/m   Wt Readings from Last 3 Encounters:  04/30/18 242 lb (109.8 kg)  04/17/18 240 lb (108.9 kg)  07/27/16 243 lb (110.2 kg)     Physical Exam  Constitutional: She is  oriented to person, place, and time. She appears well-developed.  HENT:  Head: Normocephalic and atraumatic.  Eyes: EOM are normal.  Neck: Normal range of motion. Neck supple. No tracheal deviation present. No thyromegaly present.  Cardiovascular: Normal rate.  Pulmonary/Chest: Effort normal.  Abdominal: There is no tenderness. There is no guarding.  Musculoskeletal: Normal range of motion. She exhibits no edema.  Neurological: She is alert and oriented to person, place, and time. She has normal reflexes. No cranial nerve deficit. Coordination normal.  Skin: Skin is warm and dry. No rash noted. No erythema. No pallor.  Psychiatric: She has a normal mood and affect. Judgment normal.    CMP     Component Value Date/Time   NA 138 07/27/2016 1005   K 4.7 07/27/2016 1005   CL 104 07/27/2016 1005   CO2 23 07/27/2016 1005   GLUCOSE 328 (H) 07/27/2016 1005   BUN 18 07/27/2016 1005   CREATININE 1.00 07/27/2016 1005   CALCIUM 10.2 07/27/2016 1005   GFRNONAA >60 07/27/2016 1005   GFRAA >60 07/27/2016 1005   April 2019 she reports A1c was 9%.       Assessment & Plan:   1. Type 2 diabetes mellitus with stage 3 chronic kidney disease, with long-term current use of insulin (HCC)   - Alyssa Fox has currently uncontrolled symptomatic type 2 DM since 62 years of age. -She came with consistent, however significantly above target glycemic profile both fasting and postprandial.  Her A1c prior to her last visit was 9%.   - Recent labs reviewed, stage 3 renal insufficiency.  -her diabetes is complicated by coronary artery disease which required stent placement in 2015, renal insufficiency, obesity/sedentary life and Alyssa Fox remains at a high risk for more acute and chronic complications which include CAD, CVA, CKD, retinopathy, and neuropathy. These are all discussed in detail with the patient.  - I have counseled her on diet management and weight loss, by adopting a carbohydrate restricted/protein rich diet.  -  Suggestion is made for her to avoid simple carbohydrates  from her diet including Cakes, Sweet Desserts / Pastries, Ice Cream, Soda (diet and regular), Sweet Tea, Candies, Chips, Cookies, Store Bought Juices, Alcohol in Excess of  1-2 drinks a day, Artificial Sweeteners, and "Sugar-free" Products. This will help patient to have stable blood glucose profile and potentially avoid unintended weight gain.   - I encouraged her to switch to  unprocessed or minimally processed complex starch and increased protein intake (animal or plant source), fruits, and vegetables.  - she is advised to stick to a routine mealtimes to eat 3 meals  a day and avoid unnecessary snacks ( to snack only to correct hypoglycemia).   - she will be scheduled with Alyssa Fox, RDN, CDE for individualized diabetes education.  - I have approached her with the following individualized plan to manage diabetes and patient agrees:   -Based on her current glycemic burden, she may require intensive treatment with basal/bolus insulin.   -She has some  room on her basal insulin before escalation.   -I discussed and increased her Basaglar to 60 units nightly, associated with strict monitoring of blood glucose 2 times a day-before breakfast and at bedtime until her next visit in 8 weeks.    - I advised her to continue  Victoza  1.8 mg of cutaneously daily, continue metformin 750 mg ER daily after breakfast.   - Patient is warned not to take insulin without proper monitoring per  orders.  -Patient is encouraged to call clinic for blood glucose levels less than 70 or above 300 mg /dl.  -Patient is not a candidate for SGLT2 inhibitors, nor full dose of metformin due to CKD.  - Patient specific target  A1c;  LDL, HDL, Triglycerides, and  Waist Circumference were discussed in detail.  2) BP/HTN: Her blood pressure is controlled to target.  She is advised to continue her current blood pressure medications including lisinopril 5 mg p.o. daily.   3) Lipids/HPL:   she does not have recent lipid panel to review.  She is advised to continue atorvastatin 40 mg p.o. nightly.   4)  Weight/Diet: CDE Consult will be initiated , exercise, and detailed carbohydrates information provided.  She may benefit from weight loss surgery, which is briefly discussed with her.  This will be explored on subsequent visits.  5) Chronic Care/Health Maintenance:  -she  is on ACEI/ARB and Statin medications and  is encouraged to continue to follow up with Ophthalmology, Dentist,  Podiatrist at least yearly or according to recommendations, and advised to  stay away from smoking. I have recommended yearly flu vaccine and pneumonia vaccination at least every 5 years; moderate intensity exercise for up to 150 minutes weekly; and  sleep for at least 7 hours a day.  - I advised patient to maintain close follow up with Jonetta Speak, MD for primary care needs.  - Time spent with the patient: 25 min, of which >50% was spent in reviewing her blood glucose logs , discussing her hypo-  and hyper-glycemic episodes, reviewing her current and  previous labs and insulin doses and developing a plan to avoid hypo- and hyper-glycemia. Please refer to Patient Instructions for Blood Glucose Monitoring and Insulin/Medications Dosing Guide"  in media tab for additional information. Alyssa Fox participated in the discussions, expressed understanding, and voiced agreement with the above plans.  All questions were answered to her satisfaction. she is encouraged to contact clinic should she have any questions or concerns prior to her return visit.   Follow up plan: - Return in about 8 weeks (around 06/25/2018) for meter, and logs.  Alyssa Lunch, MD [facility] [address] [place], [place] [address] Phone: [phone]  Fax: [phone]    04/30/2018, 5:50 PM  This note was partially dictated with voice recognition software. Similar sounding words can be transcribed inadequately or may not  be corrected upon review.

## 2018-06-25 ENCOUNTER — Ambulatory Visit: Payer: 59 | Admitting: "Endocrinology

## 2018-06-26 ENCOUNTER — Encounter: Payer: 59 | Attending: Nephrology | Admitting: Nutrition

## 2018-06-26 VITALS — Ht 64.0 in | Wt 239.0 lb

## 2018-06-26 DIAGNOSIS — Z6841 Body Mass Index (BMI) 40.0 and over, adult: Secondary | ICD-10-CM | POA: Diagnosis not present

## 2018-06-26 DIAGNOSIS — Z794 Long term (current) use of insulin: Secondary | ICD-10-CM | POA: Diagnosis not present

## 2018-06-26 DIAGNOSIS — IMO0002 Reserved for concepts with insufficient information to code with codable children: Secondary | ICD-10-CM

## 2018-06-26 DIAGNOSIS — E1122 Type 2 diabetes mellitus with diabetic chronic kidney disease: Secondary | ICD-10-CM | POA: Insufficient documentation

## 2018-06-26 DIAGNOSIS — E1165 Type 2 diabetes mellitus with hyperglycemia: Secondary | ICD-10-CM

## 2018-06-26 DIAGNOSIS — E118 Type 2 diabetes mellitus with unspecified complications: Secondary | ICD-10-CM

## 2018-06-26 DIAGNOSIS — Z713 Dietary counseling and surveillance: Secondary | ICD-10-CM | POA: Diagnosis present

## 2018-06-26 DIAGNOSIS — N183 Chronic kidney disease, stage 3 (moderate): Secondary | ICD-10-CM | POA: Insufficient documentation

## 2018-06-26 DIAGNOSIS — E669 Obesity, unspecified: Secondary | ICD-10-CM

## 2018-06-26 LAB — COMPLETE METABOLIC PANEL WITH GFR
AG Ratio: 1.4 (calc) (ref 1.0–2.5)
ALKALINE PHOSPHATASE (APISO): 116 U/L (ref 33–130)
ALT: 19 U/L (ref 6–29)
AST: 17 U/L (ref 10–35)
Albumin: 4.2 g/dL (ref 3.6–5.1)
BUN/Creatinine Ratio: 16 (calc) (ref 6–22)
BUN: 19 mg/dL (ref 7–25)
CALCIUM: 9.9 mg/dL (ref 8.6–10.4)
CO2: 29 mmol/L (ref 20–32)
CREATININE: 1.19 mg/dL — AB (ref 0.50–0.99)
Chloride: 104 mmol/L (ref 98–110)
GFR, EST NON AFRICAN AMERICAN: 49 mL/min/{1.73_m2} — AB (ref >=60)
GFR, Est African American: 57 mL/min/{1.73_m2} — ABNORMAL LOW (ref >=60)
Globulin: 3.1 g/dL (calc) (ref 1.9–3.7)
Glucose, Bld: 112 mg/dL (ref 65–139)
POTASSIUM: 4.3 mmol/L (ref 3.5–5.3)
SODIUM: 139 mmol/L (ref 135–146)
Total Bilirubin: 0.9 mg/dL (ref 0.2–1.2)
Total Protein: 7.3 g/dL (ref 6.1–8.1)

## 2018-06-26 LAB — T4, FREE: FREE T4: 1.1 ng/dL (ref 0.8–1.8)

## 2018-06-26 LAB — HEMOGLOBIN A1C
EAG (MMOL/L): 9 (calc)
HEMOGLOBIN A1C: 7.3 %{Hb} — AB (ref <5.7)
MEAN PLASMA GLUCOSE: 163 (calc)

## 2018-06-26 LAB — VITAMIN D 25 HYDROXY (VIT D DEFICIENCY, FRACTURES): VIT D 25 HYDROXY: 30 ng/mL (ref 30–100)

## 2018-06-26 LAB — MICROALBUMIN / CREATININE URINE RATIO
Creatinine, Urine: 86 mg/dL (ref 20–275)
MICROALB/CREAT RATIO: 8 ug/mg{creat} (ref <30)
Microalb, Ur: 0.7 mg/dL

## 2018-06-26 LAB — TSH: TSH: 1.56 mIU/L (ref 0.40–4.50)

## 2018-06-26 NOTE — Patient Instructions (Signed)
Goals 1/ Follow MY Plate 2. Eat 2-3 carb choces per meal 3.  Increase fresh fruits and vegetables. 4. Drink 6-7 bottles of water per day 5. Increase exercise Lose 1-2 lbs per week Aim for BS 80-130 in am and Less than 150 before bed. Try to eat before 7 pm.

## 2018-06-26 NOTE — Progress Notes (Signed)
  Medical Nutrition Therapy:  Appt start time: 1530 end time:  1630.   Assessment:  Primary concerns today: Diabetes Type 2. Lives by herself. Sees Dr. Fransico Him, endocrinology.  Metformin 750 mg ER daily.  And Victoza.  Basaglar 60 units.  Eats 3 meals per day PHysical actviity: rides exercise bike some. Doesn't like to walk outdoors due to dogs. A1C 7.3%. She notes her A1C usually 6% and recently just "jumped up." Testing BID. 122- 175 mg/dl 488-891 mg/dl. Changes Recently made: Not eating late at night, riding exercise bike 10 minutes a day, cut out snacks and drinking water. Wants to  Lose weight. BMI > 40. Engaged to make changes to improve her DM and be healthier.  Lab Results  Component Value Date   HGBA1C 7.3 (H) 06/25/2018     CMP Latest Ref Rng & Units 06/25/2018 07/27/2016  Glucose 65 - 139 mg/dL 694 503(U)  BUN 7 - 25 mg/dL 19 18  Creatinine 8.82 - 0.99 mg/dL 8.00(L) 4.91  Sodium 791 - 146 mmol/L 139 138  Potassium 3.5 - 5.3 mmol/L 4.3 4.7  Chloride 98 - 110 mmol/L 104 104  CO2 20 - 32 mmol/L 29 23  Calcium 8.6 - 10.4 mg/dL 9.9 50.5  Total Protein 6.1 - 8.1 g/dL 7.3 -  Total Bilirubin 0.2 - 1.2 mg/dL 0.9 -  AST 10 - 35 U/L 17 -  ALT 6 - 29 U/L 19 -   Wt Readings from Last 3 Encounters:  06/26/18 239 lb (108.4 kg)  04/30/18 242 lb (109.8 kg)  04/17/18 240 lb (108.9 kg)   Ht Readings from Last 3 Encounters:  06/26/18 5\' 4"  (1.626 m)  04/30/18 5\' 5"  (1.651 m)  04/17/18 5\' 5"  (1.651 m)   Body mass index is 41.02 kg/m.  Preferred Learning Style:   No preference indicated   Learning Readiness:    Ready  Change in progress   MEDICATIONS:   DIETARY INTAKE:  B Mini bagels 1, coffee L)hamburger l/t/m with bun, water D) Chicken, baked brussel sprouds and mashed potatoes and water  Beverages: water  Usual physical activity: rides exercise bike 10 minutes a day   Estimated energy needs: 1200-1500  calories 158  g carbohydrates 90  g protein 33 g  fat  Progress Towards Goal(s):  In progress.   Nutritional Diagnosis:  NB-1.1 Food and nutrition-related knowledge deficit As related to Diabetes.  As evidenced by A1C 7.3.    Intervention Nutrition and Diabetes education provided on My Plate, CHO counting, meal planning, portion sizes, timing of meals, avoiding snacks between meals unless having a low blood sugar, target ranges for A1C and blood sugars, signs/symptoms and treatment of hyper/hypoglycemia, monitoring blood sugars, taking medications as prescribed, benefits of exercising 30 minutes per day and prevention of complications of DM.  Goals 1/ Follow MY Plate 2. Eat 2-3 carb choces per meal 3.  Increase fresh fruits and vegetables. 4. Drink 6-7 bottles of water per day 5. Increase exercise Lose 1-2 lbs per week Aim for BS 80-130 in am and Less than 150 before bed. Try to eat before 7 pm.   Teaching Method Utilized:  Visual Auditory Hands on  Handouts given during visit include:  The Plate Method   Meal Plan Card   Barriers to learning/adherence to lifestyle change: none  Demonstrated degree of understanding via:  Teach Back   Monitoring/Evaluation:  Dietary intake, exercise, meal planning , and body weight in 1 month(s).

## 2018-07-02 ENCOUNTER — Encounter: Payer: Self-pay | Admitting: Nutrition

## 2018-07-10 ENCOUNTER — Ambulatory Visit: Payer: 59 | Admitting: Nutrition

## 2018-07-10 ENCOUNTER — Ambulatory Visit: Payer: 59 | Admitting: "Endocrinology

## 2018-07-14 ENCOUNTER — Other Ambulatory Visit: Payer: Self-pay

## 2018-07-14 MED ORDER — BASAGLAR KWIKPEN 100 UNIT/ML ~~LOC~~ SOPN: 60.0000 [IU] | PEN_INJECTOR | Freq: Every day | SUBCUTANEOUS | 1 refills | Status: DC

## 2018-07-29 ENCOUNTER — Ambulatory Visit: Payer: 59 | Admitting: "Endocrinology

## 2018-08-27 ENCOUNTER — Ambulatory Visit (INDEPENDENT_AMBULATORY_CARE_PROVIDER_SITE_OTHER): Payer: 59 | Admitting: "Endocrinology

## 2018-08-27 ENCOUNTER — Encounter: Payer: Self-pay | Admitting: "Endocrinology

## 2018-08-27 VITALS — BP 126/82 | HR 81 | Ht 65.0 in | Wt 245.0 lb

## 2018-08-27 DIAGNOSIS — I10 Essential (primary) hypertension: Secondary | ICD-10-CM

## 2018-08-27 DIAGNOSIS — E1122 Type 2 diabetes mellitus with diabetic chronic kidney disease: Secondary | ICD-10-CM

## 2018-08-27 DIAGNOSIS — Z794 Long term (current) use of insulin: Secondary | ICD-10-CM

## 2018-08-27 DIAGNOSIS — N183 Chronic kidney disease, stage 3 (moderate): Secondary | ICD-10-CM

## 2018-08-27 DIAGNOSIS — E782 Mixed hyperlipidemia: Secondary | ICD-10-CM

## 2018-08-27 MED ORDER — BASAGLAR KWIKPEN 100 UNIT/ML ~~LOC~~ SOPN: 70.0000 [IU] | PEN_INJECTOR | Freq: Every day | SUBCUTANEOUS | 1 refills | Status: DC

## 2018-08-27 NOTE — Progress Notes (Signed)
Endocrinology follow-up  Note       08/27/2018, 5:20 PM   Subjective:    [name], female    DOB: 06-02-1956.  Alyssa Fox is being seen in consultation for management of currently uncontrolled symptomatic 2 diabetes, hyperlipidemia, hypertension, obesity. PMD:   Jonetta Speak, MD.   Past Medical History:  Diagnosis Date  . DM type 2 causing vascular disease (HCC)   . Hypertension   . MI (myocardial infarction) [facility])    Past Surgical History:  Procedure Laterality Date  . CHOLECYSTECTOMY    . CORONARY ANGIOPLASTY WITH STENT PLACEMENT    . TUBAL LIGATION     Social History   Socioeconomic History  . Marital status: Married    Spouse name: Not on file  . Number of children: Not on file  . Years of education: Not on file  . Highest education level: Not on file  Occupational History  . Not on file  Social Needs  . Financial resource strain: Not on file  . Food insecurity:    Worry: Not on file    Inability: Not on file  . Transportation needs:    Medical: Not on file    Non-medical: Not on file  Tobacco Use  . Smoking status: Never Smoker  . Smokeless tobacco: Never Used  Substance and Sexual Activity  . Alcohol use: No  . Drug use: No  . Sexual activity: Not on file  Lifestyle  . Physical activity:    Days per week: Not on file    Minutes per session: Not on file  . Stress: Not on file  Relationships  . Social connections:    Talks on phone: Not on file    Gets together: Not on file    Attends religious service: Not on file    Active member of club or organization: Not on file    Attends meetings of clubs or organizations: Not on file    Relationship status: Not on file  Other Topics Concern  . Not on file  Social History Narrative  . Not on file   Outpatient Encounter Medications as of 08/27/2018  Medication Sig  . allopurinol (ZYLOPRIM) 100  MG tablet every morning.  Marland Kitchen aspirin EC 81 MG tablet Take by mouth daily.  Marland Kitchen atorvastatin (LIPITOR) 40 MG tablet Take 40 mg by mouth daily.  Marland Kitchen gabapentin (NEURONTIN) 100 MG capsule Take 100 mg by mouth 3 (three) times daily.  . Insulin Glargine (BASAGLAR KWIKPEN) 100 UNIT/ML SOPN Inject 0.7 mLs (70 Units total) into the skin at bedtime.  . liraglutide (VICTOZA) 18 MG/3ML SOPN Inject 1.8 mg into the skin at bedtime.  Marland Kitchen lisinopril (PRINIVIL,ZESTRIL) 5 MG tablet Take 5 mg by mouth daily.  . meloxicam (MOBIC) 15 MG tablet Take 15 mg by mouth daily.  . metFORMIN (GLUCOPHAGE-XR) 750 MG 24 hr tablet Take 750 mg by mouth daily with breakfast.  . nebivolol (BYSTOLIC) 5 MG tablet Take 5 mg by mouth daily.  . nitroGLYCERIN (NITROSTAT) 0.4 MG SL tablet Place 0.4 mg under the tongue every 5 (five) minutes as needed for chest pain.  Marland Kitchen  ticagrelor (BRILINTA) 90 MG TABS tablet Take by mouth 2 (two) times daily.  . [DISCONTINUED] Insulin Glargine (BASAGLAR KWIKPEN) 100 UNIT/ML SOPN Inject 0.6 mLs (60 Units total) into the skin at bedtime.   No facility-administered encounter medications on file as of 08/27/2018.     ALLERGIES: Allergies  Allergen Reactions  . Latex     VACCINATION STATUS:  There is no immunization history on file for this patient.  Diabetes  She presents for her follow-up diabetic visit. She has type 2 diabetes mellitus. Onset time: She was diagnosed at approximate age of 7 years. Her disease course has been improving. There are no hypoglycemic associated symptoms. Pertinent negatives for hypoglycemia include no confusion, headaches, pallor or seizures. Associated symptoms include polydipsia and polyuria. Pertinent negatives for diabetes include no chest pain and no polyphagia. There are no hypoglycemic complications. Symptoms are improving. Diabetic complications include heart disease. (She is status post 3 stent placement for coronary artery disease and December 2015-about the time of her  diagnosis with diabetes.) Risk factors for coronary artery disease include dyslipidemia, diabetes mellitus, family history, obesity, hypertension, post-menopausal and sedentary lifestyle. Current diabetic treatment includes insulin injections and oral agent (monotherapy) (She is currently on Basaglar 40 units nightly, Victoza 1.2 mg daily, and metformin 750 mg p.o. daily.). Her weight is increasing steadily. She is following a generally unhealthy diet. When asked about meal planning, she reported none. She has not had a previous visit with a dietitian. She rarely participates in exercise. Her breakfast blood glucose range is generally 140-180 mg/dl. Her bedtime blood glucose range is generally 140-180 mg/dl. Her overall blood glucose range is 140-180 mg/dl. An ACE inhibitor/angiotensin II receptor blocker is being taken. Eye exam is current.  Hyperlipidemia  This is a chronic problem. The current episode started more than 1 year ago. Exacerbating diseases include diabetes and obesity. Pertinent negatives include no chest pain, myalgias or shortness of breath. Current antihyperlipidemic treatment includes statins. Risk factors for coronary artery disease include hypertension, dyslipidemia, a sedentary lifestyle, post-menopausal, diabetes mellitus and obesity.  Hypertension  This is a chronic problem. The current episode started more than 1 year ago. Pertinent negatives include no chest pain, headaches, palpitations or shortness of breath. Risk factors for coronary artery disease include dyslipidemia, diabetes mellitus, family history, post-menopausal state, obesity and sedentary lifestyle. Past treatments include ACE inhibitors.      Review of Systems  Constitutional: Negative for chills, fever and unexpected weight change.  HENT: Negative for trouble swallowing and voice change.   Eyes: Negative for visual disturbance.  Respiratory: Negative for cough, shortness of breath and wheezing.    Cardiovascular: Negative for chest pain, palpitations and leg swelling.  Gastrointestinal: Negative for diarrhea, nausea and vomiting.  Endocrine: Positive for polydipsia and polyuria. Negative for cold intolerance, heat intolerance and polyphagia.  Musculoskeletal: Negative for arthralgias and myalgias.  Skin: Negative for color change, pallor, rash and wound.  Neurological: Negative for seizures and headaches.  Psychiatric/Behavioral: Negative for confusion and suicidal ideas.    Objective:    BP 126/82   Pulse 81   Ht 5\' 5"  (1.651 m)   Wt 245 lb (111.1 kg)   BMI 40.77 kg/m   Wt Readings from Last 3 Encounters:  08/27/18 245 lb (111.1 kg)  06/26/18 239 lb (108.4 kg)  04/30/18 242 lb (109.8 kg)     Physical Exam  Constitutional: She is oriented to person, place, and time. She appears well-developed.  HENT:  Head: Normocephalic and atraumatic.  Eyes: EOM are normal.  Neck: Normal range of motion. Neck supple. No tracheal deviation present. No thyromegaly present.  Cardiovascular: Normal rate.  Pulmonary/Chest: Effort normal.  Abdominal: There is no tenderness. There is no guarding.  Musculoskeletal: Normal range of motion. She exhibits no edema.  Neurological: She is alert and oriented to person, place, and time. She has normal reflexes. No cranial nerve deficit. Coordination normal.  Skin: Skin is warm and dry. No rash noted. No erythema. No pallor.  Psychiatric: She has a normal mood and affect. Judgment normal.    Recent Results (from the past 2160 hour(s))  COMPLETE METABOLIC PANEL WITH GFR     Status: Abnormal   Collection Time: 06/25/18  4:07 PM  Result Value Ref Range   Glucose, Bld 112 65 - 139 mg/dL    Comment: .        Non-fasting reference interval .    BUN 19 7 - 25 mg/dL   Creat 1.61 (H) 0.96 - 0.99 mg/dL    Comment: For patients >79 years of age, the reference limit for Creatinine is approximately 13% higher for people identified as  African-American. .    GFR, Est Non African American 49 (L) > OR = 60 mL/min/1.93m2   GFR, Est African American 57 (L) > OR = 60 mL/min/1.27m2   BUN/Creatinine Ratio 16 6 - 22 (calc)   Sodium 139 135 - 146 mmol/L   Potassium 4.3 3.5 - 5.3 mmol/L   Chloride 104 98 - 110 mmol/L   CO2 29 20 - 32 mmol/L   Calcium 9.9 8.6 - 10.4 mg/dL   Total Protein 7.3 6.1 - 8.1 g/dL   Albumin 4.2 3.6 - 5.1 g/dL   Globulin 3.1 1.9 - 3.7 g/dL (calc)   AG Ratio 1.4 1.0 - 2.5 (calc)   Total Bilirubin 0.9 0.2 - 1.2 mg/dL   Alkaline phosphatase (APISO) 116 33 - 130 U/L   AST 17 10 - 35 U/L   ALT 19 6 - 29 U/L  Hemoglobin A1c     Status: Abnormal   Collection Time: 06/25/18  4:07 PM  Result Value Ref Range   Hgb A1c MFr Bld 7.3 (H) <5.7 % of total Hgb    Comment: For someone without known diabetes, a hemoglobin A1c value of 6.5% or greater indicates that they may have  diabetes and this should be confirmed with a follow-up  test. . For someone with known diabetes, a value <7% indicates  that their diabetes is well controlled and a value  greater than or equal to 7% indicates suboptimal  control. A1c targets should be individualized based on  duration of diabetes, age, comorbid conditions, and  other considerations. . Currently, no consensus exists regarding use of hemoglobin A1c for diagnosis of diabetes for children. .    Mean Plasma Glucose 163 (calc)   eAG (mmol/L) 9.0 (calc)  TSH     Status: None   Collection Time: 06/25/18  4:07 PM  Result Value Ref Range   TSH 1.56 0.40 - 4.50 mIU/L  T4, free     Status: None   Collection Time: 06/25/18  4:07 PM  Result Value Ref Range   Free T4 1.1 0.8 - 1.8 ng/dL  VITAMIN D 25 Hydroxy (Vit-D Deficiency, Fractures)     Status: None   Collection Time: 06/25/18  4:07 PM  Result Value Ref Range   Vit D, 25-Hydroxy 30 30 - 100 ng/mL    Comment: Vitamin D Status  25-OH Vitamin D: . Deficiency:                    <20 ng/mL Insufficiency:              20 - 29 ng/mL Optimal:                 > or = 30 ng/mL . For 25-OH Vitamin D testing on patients on  D2-supplementation and patients for whom quantitation  of D2 and D3 fractions is required, the QuestAssureD(TM) 25-OH VIT D, (D2,D3), LC/MS/MS is recommended: order  code 40981 (patients >20yrs). . For more information on this test, go to: http://education.questdiagnostics.com/faq/FAQ163 (This link is being provided for  informational/educational purposes only.)   Microalbumin / creatinine urine ratio     Status: None   Collection Time: 06/25/18  4:07 PM  Result Value Ref Range   Creatinine, Urine 86 20 - 275 mg/dL   Microalb, Ur 0.7 mg/dL    Comment: Reference Range Not established    Microalb Creat Ratio 8 <30 mcg/mg creat    Comment: . The ADA defines abnormalities in albumin excretion as follows: Marland Kitchen Category         Result (mcg/mg creatinine) . Normal                    <30 Microalbuminuria         30-299  Clinical albuminuria   > OR = 300 . The ADA recommends that at least two of three specimens collected within a 3-6 month period be abnormal before considering a patient to be within a diagnostic category.      April 2019 she reports A1c was 9%.      Assessment & Plan:   1. Type 2 diabetes mellitus with stage 3 chronic kidney disease, with long-term current use of insulin (HCC)   - Alyssa Fox has currently uncontrolled symptomatic type 2 DM since 62 years of age. -She came with significantly improved glycemic profile both fasting and postprandially.  Her previsit labs are showing A1c of 7.3%, improving from 9% during her last visit.   - Recent labs reviewed, stage 3 renal insufficiency.  -her diabetes is complicated by coronary artery disease which required stent placement in 2015, renal insufficiency, obesity/sedentary life and Alyssa Fox remains at a high risk for more acute and chronic complications which include CAD, CVA, CKD,  retinopathy, and neuropathy. These are all discussed in detail with the patient.  - I have counseled her on diet management and weight loss, by adopting a carbohydrate restricted/protein rich diet.  She still admits to dietary discretion.  -  Suggestion is made for her to avoid simple carbohydrates  from her diet including Cakes, Sweet Desserts / Pastries, Ice Cream, Soda (diet and regular), Sweet Tea, Candies, Chips, Cookies, Store Bought Juices, Alcohol in Excess of  1-2 drinks a day, Artificial Sweeteners, and "Sugar-free" Products. This will help patient to have stable blood glucose profile and potentially avoid unintended weight gain.   - I encouraged her to switch to  unprocessed or minimally processed complex starch and increased protein intake (animal or plant source), fruits, and vegetables.  - she is advised to stick to a routine mealtimes to eat 3 meals  a day and avoid unnecessary snacks ( to snack only to correct hypoglycemia).   - I have approached her with the following individualized plan to manage diabetes and patient agrees:   -Based on her resident  patient with near target glycemia, she will not require prandial insulin for now.    -I discussed and increased her Basaglar to 70 units nightly,  associated with strict monitoring of blood glucose 2 times a day-before breakfast and at bedtime until her next visit in 8 weeks.    - I advised her to continue  Victoza  1.8 mg of cutaneously daily, continue metformin 750 mg ER daily after breakfast.   - Patient is warned not to take insulin without proper monitoring per orders.  -Patient is encouraged to call clinic for blood glucose levels less than 70 or above 300 mg /dl.  -Patient is not a candidate for SGLT2 inhibitors, nor full dose of metformin due to CKD.  - Patient specific target  A1c;  LDL, HDL, Triglycerides, and  Waist Circumference were discussed in detail.  2) BP/HTN: Her blood pressure is controlled to target.  She  is advised to continue her current blood pressure medications including lisinopril 5 mg p.o. daily.    3) Lipids/HPL:   she does not have recent lipid panel to review.  She is advised to continue atorvastatin 40 mg p.o. nightly.    4)  Weight/Diet: CDE Consult has been  initiated , exercise, and detailed carbohydrates information provided.  I discussed with her the fact that loss of 5-10% of body weight loss will have the most impact on her diabetes, hyperlipidemia, hypertension, sleep apnea.  She is a perfect candidate for bariatric surgery which I have discussed in more detail.  She is interested to explore this option, contact information /brochure provided to the patient.   5) Chronic Care/Health Maintenance:  -she  is on ACEI/ARB and Statin medications and  is encouraged to continue to follow up with Ophthalmology, Dentist,  Podiatrist at least yearly or according to recommendations, and advised to  stay away from smoking. I have recommended yearly flu vaccine and pneumonia vaccination at least every 5 years; moderate intensity exercise for up to 150 minutes weekly; and  sleep for at least 7 hours a day.  - I advised patient to maintain close follow up with Jonetta Speak, MD for primary care needs.  - Time spent with the patient: 25 min, of which >50% was spent in reviewing her blood glucose logs , discussing her hypo- and hyper-glycemic episodes, reviewing her current and  previous labs and insulin doses and developing a plan to avoid hypo- and hyper-glycemia. Please refer to Patient Instructions for Blood Glucose Monitoring and Insulin/Medications Dosing Guide"  in media tab for additional information. Alyssa Fox participated in the discussions, expressed understanding, and voiced agreement with the above plans.  All questions were answered to her satisfaction. she is encouraged to contact clinic should she have any questions or concerns prior to her return visit.  Follow up plan: -  Return in about 3 months (around 11/26/2018) for Follow up with Pre-visit Labs, Meter, and Logs.  Marquis Lunch, MD [facility] [address] [place], [place] [address] Phone: [phone]  Fax: [phone]    08/27/2018, 5:20 PM  This note was partially dictated with voice recognition software. Similar sounding words can be transcribed inadequately or may not  be corrected upon review.

## 2018-08-27 NOTE — Patient Instructions (Signed)

## 2018-11-18 ENCOUNTER — Other Ambulatory Visit: Payer: Self-pay

## 2018-11-18 MED ORDER — BASAGLAR KWIKPEN 100 UNIT/ML ~~LOC~~ SOPN: 70.0000 [IU] | PEN_INJECTOR | Freq: Every day | SUBCUTANEOUS | 1 refills | Status: DC

## 2018-11-25 LAB — COMPLETE METABOLIC PANEL WITH GFR
AG Ratio: 1.4 (calc) (ref 1.0–2.5)
ALKALINE PHOSPHATASE (APISO): 120 U/L (ref 33–130)
ALT: 20 U/L (ref 6–29)
AST: 19 U/L (ref 10–35)
Albumin: 4.3 g/dL (ref 3.6–5.1)
BILIRUBIN TOTAL: 0.7 mg/dL (ref 0.2–1.2)
BUN/Creatinine Ratio: 17 (calc) (ref 6–22)
BUN: 21 mg/dL (ref 7–25)
CHLORIDE: 104 mmol/L (ref 98–110)
CO2: 26 mmol/L (ref 20–32)
Calcium: 10 mg/dL (ref 8.6–10.4)
Creat: 1.26 mg/dL — ABNORMAL HIGH (ref 0.50–0.99)
GFR, Est African American: 53 mL/min/{1.73_m2} — ABNORMAL LOW (ref >=60)
GFR, Est Non African American: 46 mL/min/{1.73_m2} — ABNORMAL LOW (ref >=60)
GLUCOSE: 122 mg/dL (ref 65–139)
Globulin: 3.1 g/dL (calc) (ref 1.9–3.7)
Potassium: 4.1 mmol/L (ref 3.5–5.3)
Sodium: 140 mmol/L (ref 135–146)
Total Protein: 7.4 g/dL (ref 6.1–8.1)

## 2018-11-25 LAB — HEMOGLOBIN A1C
EAG (MMOL/L): 7.9 (calc)
Hgb A1c MFr Bld: 6.6 % of total Hgb — ABNORMAL HIGH (ref <5.7)
Mean Plasma Glucose: 143 (calc)

## 2018-12-01 ENCOUNTER — Encounter: Payer: Self-pay | Admitting: "Endocrinology

## 2018-12-01 ENCOUNTER — Ambulatory Visit (INDEPENDENT_AMBULATORY_CARE_PROVIDER_SITE_OTHER): Payer: 59 | Admitting: "Endocrinology

## 2018-12-01 VITALS — BP 121/76 | HR 81 | Ht 65.0 in | Wt 243.0 lb

## 2018-12-01 DIAGNOSIS — N183 Chronic kidney disease, stage 3 unspecified: Secondary | ICD-10-CM

## 2018-12-01 DIAGNOSIS — E1122 Type 2 diabetes mellitus with diabetic chronic kidney disease: Secondary | ICD-10-CM | POA: Diagnosis not present

## 2018-12-01 DIAGNOSIS — I10 Essential (primary) hypertension: Secondary | ICD-10-CM

## 2018-12-01 DIAGNOSIS — Z794 Long term (current) use of insulin: Secondary | ICD-10-CM

## 2018-12-01 DIAGNOSIS — E782 Mixed hyperlipidemia: Secondary | ICD-10-CM

## 2018-12-01 MED ORDER — GABAPENTIN 100 MG PO CAPS: 100.0000 mg | ORAL_CAPSULE | Freq: Three times a day (TID) | ORAL | 3 refills | Status: DC

## 2018-12-01 NOTE — Patient Instructions (Signed)

## 2018-12-01 NOTE — Progress Notes (Signed)
Endocrinology follow-up  Note       12/01/2018, 4:16 PM   Subjective:    [name], female    DOB: [birth year].  Alyssa Fox is being seen in consultation for management of currently uncontrolled symptomatic 2 diabetes, hyperlipidemia, hypertension, obesity. PMD:   Jonetta Speak, MD.   Past Medical History:  Diagnosis Date  . DM type 2 causing vascular disease (HCC)   . Hypertension   . MI (myocardial infarction) [facility])    Past Surgical History:  Procedure Laterality Date  . CHOLECYSTECTOMY    . CORONARY ANGIOPLASTY WITH STENT PLACEMENT    . TUBAL LIGATION     Social History   Socioeconomic History  . Marital status: Married    Spouse name: Not on file  . Number of children: Not on file  . Years of education: Not on file  . Highest education level: Not on file  Occupational History  . Not on file  Social Needs  . Financial resource strain: Not on file  . Food insecurity:    Worry: Not on file    Inability: Not on file  . Transportation needs:    Medical: Not on file    Non-medical: Not on file  Tobacco Use  . Smoking status: Never Smoker  . Smokeless tobacco: Never Used  Substance and Sexual Activity  . Alcohol use: No  . Drug use: No  . Sexual activity: Not on file  Lifestyle  . Physical activity:    Days per week: Not on file    Minutes per session: Not on file  . Stress: Not on file  Relationships  . Social connections:    Talks on phone: Not on file    Gets together: Not on file    Attends religious service: Not on file    Active member of club or organization: Not on file    Attends meetings of clubs or organizations: Not on file    Relationship status: Not on file  Other Topics Concern  . Not on file  Social History Narrative  . Not on file   Outpatient Encounter Medications as of 12/01/2018  Medication Sig  . allopurinol (ZYLOPRIM)  100 MG tablet every morning.  Marland Kitchen aspirin EC 81 MG tablet Take by mouth daily.  Marland Kitchen atorvastatin (LIPITOR) 40 MG tablet Take 40 mg by mouth daily.  Marland Kitchen gabapentin (NEURONTIN) 100 MG capsule Take 1 capsule (100 mg total) by mouth 3 (three) times daily.  . Insulin Glargine (BASAGLAR KWIKPEN) 100 UNIT/ML SOPN Inject 0.7 mLs (70 Units total) into the skin at bedtime.  . liraglutide (VICTOZA) 18 MG/3ML SOPN Inject 1.8 mg into the skin at bedtime.  Marland Kitchen lisinopril (PRINIVIL,ZESTRIL) 5 MG tablet Take 5 mg by mouth daily.  . meloxicam (MOBIC) 15 MG tablet Take 15 mg by mouth daily.  . metFORMIN (GLUCOPHAGE-XR) 750 MG 24 hr tablet Take 750 mg by mouth daily with breakfast.  . nebivolol (BYSTOLIC) 5 MG tablet Take 5 mg by mouth daily.  . nitroGLYCERIN (NITROSTAT) 0.4 MG SL tablet Place 0.4 mg under the tongue every 5 (five) minutes as needed  for chest pain.  . ticagrelor (BRILINTA) 90 MG TABS tablet Take by mouth 2 (two) times daily.  . [DISCONTINUED] gabapentin (NEURONTIN) 100 MG capsule Take 100 mg by mouth 3 (three) times daily.   No facility-administered encounter medications on file as of 12/01/2018.     ALLERGIES: Allergies  Allergen Reactions  . Latex     VACCINATION STATUS:  There is no immunization history on file for this patient.  Diabetes  She presents for her follow-up diabetic visit. She has type 2 diabetes mellitus. Onset time: She was diagnosed at approximate age of 59 years. Her disease course has been improving. There are no hypoglycemic associated symptoms. Pertinent negatives for hypoglycemia include no confusion, headaches, pallor or seizures. Associated symptoms include polydipsia and polyuria. Pertinent negatives for diabetes include no chest pain and no polyphagia. There are no hypoglycemic complications. Symptoms are improving. Diabetic complications include heart disease. (She is status post 3 stent placement for coronary artery disease and December 2015-about the time of her  diagnosis with diabetes.) Risk factors for coronary artery disease include dyslipidemia, diabetes mellitus, family history, obesity, hypertension, post-menopausal and sedentary lifestyle. Current diabetic treatment includes insulin injections and oral agent (monotherapy) (She is currently on Basaglar 40 units nightly, Victoza 1.2 mg daily, and metformin 750 mg p.o. daily.). Her weight is increasing steadily. She is following a generally unhealthy diet. When asked about meal planning, she reported none. She has not had a previous visit with a dietitian. She rarely participates in exercise. Her breakfast blood glucose range is generally 140-180 mg/dl. Her bedtime blood glucose range is generally 140-180 mg/dl. Her overall blood glucose range is 140-180 mg/dl. An ACE inhibitor/angiotensin II receptor blocker is being taken. Eye exam is current.  Hyperlipidemia  This is a chronic problem. The current episode started more than 1 year ago. Exacerbating diseases include diabetes and obesity. Pertinent negatives include no chest pain, myalgias or shortness of breath. Current antihyperlipidemic treatment includes statins. Risk factors for coronary artery disease include hypertension, dyslipidemia, a sedentary lifestyle, post-menopausal, diabetes mellitus and obesity.  Hypertension  This is a chronic problem. The current episode started more than 1 year ago. Pertinent negatives include no chest pain, headaches, palpitations or shortness of breath. Risk factors for coronary artery disease include dyslipidemia, diabetes mellitus, family history, post-menopausal state, obesity and sedentary lifestyle. Past treatments include ACE inhibitors.      Review of Systems  Constitutional: Negative for chills, fever and unexpected weight change.  HENT: Negative for trouble swallowing and voice change.   Eyes: Negative for visual disturbance.  Respiratory: Negative for cough, shortness of breath and wheezing.    Cardiovascular: Negative for chest pain, palpitations and leg swelling.  Gastrointestinal: Negative for diarrhea, nausea and vomiting.  Endocrine: Positive for polydipsia and polyuria. Negative for cold intolerance, heat intolerance and polyphagia.  Musculoskeletal: Negative for arthralgias and myalgias.  Skin: Negative for color change, pallor, rash and wound.  Neurological: Negative for seizures and headaches.  Psychiatric/Behavioral: Negative for confusion and suicidal ideas.    Objective:    BP 121/76   Pulse 81   Ht 5\' 5"  (1.651 m)   Wt 243 lb (110.2 kg)   BMI 40.44 kg/m   Wt Readings from Last 3 Encounters:  12/01/18 243 lb (110.2 kg)  08/27/18 245 lb (111.1 kg)  06/26/18 239 lb (108.4 kg)     Physical Exam Constitutional:      Appearance: She is well-developed.  HENT:     Head: Normocephalic and atraumatic.  Neck:     Musculoskeletal: Normal range of motion and neck supple.     Thyroid: No thyromegaly.     Trachea: No tracheal deviation.  Cardiovascular:     Rate and Rhythm: Normal rate.  Pulmonary:     Effort: Pulmonary effort is normal.  Abdominal:     Tenderness: There is no abdominal tenderness. There is no guarding.  Musculoskeletal: Normal range of motion.  Skin:    General: Skin is warm and dry.     Coloration: Skin is not pale.     Findings: No erythema or rash.  Neurological:     Mental Status: She is alert and oriented to person, place, and time.     Cranial Nerves: No cranial nerve deficit.     Coordination: Coordination normal.     Deep Tendon Reflexes: Reflexes are normal and symmetric.  Psychiatric:        Judgment: Judgment normal.     Recent Results (from the past 2160 hour(s))  Hemoglobin A1c     Status: Abnormal   Collection Time: 11/24/18  3:47 PM  Result Value Ref Range   Hgb A1c MFr Bld 6.6 (H) <5.7 % of total Hgb    Comment: For someone without known diabetes, a hemoglobin A1c value of 6.5% or greater indicates that they may  have  diabetes and this should be confirmed with a follow-up  test. . For someone with known diabetes, a value <7% indicates  that their diabetes is well controlled and a value  greater than or equal to 7% indicates suboptimal  control. A1c targets should be individualized based on  duration of diabetes, age, comorbid conditions, and  other considerations. . Currently, no consensus exists regarding use of hemoglobin A1c for diagnosis of diabetes for children. .    Mean Plasma Glucose 143 (calc)   eAG (mmol/L) 7.9 (calc)  COMPLETE METABOLIC PANEL WITH GFR     Status: Abnormal   Collection Time: 11/24/18  3:47 PM  Result Value Ref Range   Glucose, Bld 122 65 - 139 mg/dL    Comment: .        Non-fasting reference interval .    BUN 21 7 - 25 mg/dL   Creat 1.61 (H) 0.96 - 0.99 mg/dL    Comment: For patients >34 years of age, the reference limit for Creatinine is approximately 13% higher for people identified as African-American. .    GFR, Est Non African American 46 (L) > OR = 60 mL/min/1.74m2   GFR, Est African American 53 (L) > OR = 60 mL/min/1.35m2   BUN/Creatinine Ratio 17 6 - 22 (calc)   Sodium 140 135 - 146 mmol/L   Potassium 4.1 3.5 - 5.3 mmol/L   Chloride 104 98 - 110 mmol/L   CO2 26 20 - 32 mmol/L   Calcium 10.0 8.6 - 10.4 mg/dL   Total Protein 7.4 6.1 - 8.1 g/dL   Albumin 4.3 3.6 - 5.1 g/dL   Globulin 3.1 1.9 - 3.7 g/dL (calc)   AG Ratio 1.4 1.0 - 2.5 (calc)   Total Bilirubin 0.7 0.2 - 1.2 mg/dL   Alkaline phosphatase (APISO) 120 33 - 130 U/L   AST 19 10 - 35 U/L   ALT 20 6 - 29 U/L     April 2019 she reports A1c was 9%.      Assessment & Plan:   1. Type 2 diabetes mellitus with stage 3 chronic kidney disease, with long-term current use of insulin (HCC)   -  Alyssa Fox has currently uncontrolled symptomatic type 2 DM since 62 years of age. -She came with significantly improved glycemic profile both fasting and postprandially.  Her previsit labs  are showing A1c of 6.6%, generally improving from 9%.     - Recent labs reviewed, stage 3 renal insufficiency.  -her diabetes is complicated by coronary artery disease which required stent placement in 2015, renal insufficiency, obesity/sedentary life and Alyssa Fox remains at a high risk for more acute and chronic complications which include CAD, CVA, CKD, retinopathy, and neuropathy. These are all discussed in detail with the patient.  - I have counseled her on diet management and weight loss, by adopting a carbohydrate restricted/protein rich diet.  She still admits to dietary discretion.  -  Suggestion is made for her to avoid simple carbohydrates  from her diet including Cakes, Sweet Desserts / Pastries, Ice Cream, Soda (diet and regular), Sweet Tea, Candies, Chips, Cookies, Store Bought Juices, Alcohol in Excess of  1-2 drinks a day, Artificial Sweeteners, and "Sugar-free" Products. This will help patient to have stable blood glucose profile and potentially avoid unintended weight gain.  - I encouraged her to switch to  unprocessed or minimally processed complex starch and increased protein intake (animal or plant source), fruits, and vegetables.  - she is advised to stick to a routine mealtimes to eat 3 meals  a day and avoid unnecessary snacks ( to snack only to correct hypoglycemia).   - I have approached her with the following individualized plan to manage diabetes and patient agrees:   -She presents with  near target glycemia, she will not require prandial insulin for now.    -She is advised to continue Basaglar 70 units nightly,  associated with strict monitoring of blood glucose 2 times a day. -She is advised to continue Victoza 1.8 mg subcutaneously daily,  continue metformin 750 mg ER daily after breakfast.   - Patient is warned not to take insulin without proper monitoring per orders.  -Patient is encouraged to call clinic for blood glucose levels less than 70 or  above 300 mg /dl.  -Patient is not a candidate for SGLT2 inhibitors, nor full dose of metformin due to CKD.  - Patient specific target  A1c;  LDL, HDL, Triglycerides, and  Waist Circumference were discussed in detail.  2) BP/HTN: Her blood pressure is controlled to target.  She is advised to continue her current blood pressure medications including lisinopril 5 mg p.o. daily.    3) Lipids/HPL:   she does not have recent lipid panel to review.  She is advised to continue atorvastatin 40 mg p.o. nightly.  She will have fasting lipid panel before her next visit.  4)  Weight/Diet: CDE Consult has been  initiated , exercise, and detailed carbohydrates information provided.  I discussed with her the fact that loss of 5-10% of body weight loss will have the most impact on her diabetes, hyperlipidemia, hypertension, sleep apnea.  She is a perfect candidate for bariatric surgery which I have discussed in more detail.  She is interested to explore this option, contact information /brochure provided to the patient.   5) Chronic Care/Health Maintenance:  -she  is on ACEI/ARB and Statin medications and  is encouraged to continue to follow up with Ophthalmology, Dentist,  Podiatrist at least yearly or according to recommendations, and advised to  stay away from smoking. I have recommended yearly flu vaccine and pneumonia vaccination at least every 5 years; moderate intensity exercise  for up to 150 minutes weekly; and  sleep for at least 7 hours a day. -Foot exam is significant for peripheral neuropathy, will refill her gabapentin.  - I advised patient to maintain close follow up with Jonetta Speak, MD for primary care needs. - Time spent with the patient: 25 min, of which >50% was spent in reviewing her blood glucose logs , discussing her hypo- and hyper-glycemic episodes, reviewing her current and  previous labs and insulin doses and developing a plan to avoid hypo- and hyper-glycemia. Please refer to  Patient Instructions for Blood Glucose Monitoring and Insulin/Medications Dosing Guide"  in media tab for additional information. Alyssa Singer participated in the discussions, expressed understanding, and voiced agreement with the above plans.  All questions were answered to her satisfaction. she is encouraged to contact clinic should she have any questions or concerns prior to her return visit.   Follow up plan: - Return in about 4 months (around 04/02/2019) for Meter, and Logs.  Marquis Lunch, MD [facility] [address] [place], [place] [address] Phone: [phone]  Fax: [phone]    12/01/2018, 4:16 PM  This note was partially dictated with voice recognition software. Similar sounding words can be transcribed inadequately or may not  be corrected upon review.

## 2019-04-01 ENCOUNTER — Encounter: Payer: Self-pay | Admitting: "Endocrinology

## 2019-04-01 ENCOUNTER — Other Ambulatory Visit: Payer: Self-pay

## 2019-04-01 ENCOUNTER — Ambulatory Visit: Payer: 59 | Admitting: "Endocrinology

## 2019-04-01 ENCOUNTER — Ambulatory Visit (INDEPENDENT_AMBULATORY_CARE_PROVIDER_SITE_OTHER): Payer: 59 | Admitting: "Endocrinology

## 2019-04-01 DIAGNOSIS — E1122 Type 2 diabetes mellitus with diabetic chronic kidney disease: Secondary | ICD-10-CM | POA: Diagnosis not present

## 2019-04-01 DIAGNOSIS — N183 Chronic kidney disease, stage 3 unspecified: Secondary | ICD-10-CM

## 2019-04-01 DIAGNOSIS — Z794 Long term (current) use of insulin: Secondary | ICD-10-CM

## 2019-04-01 DIAGNOSIS — I10 Essential (primary) hypertension: Secondary | ICD-10-CM

## 2019-04-01 DIAGNOSIS — E782 Mixed hyperlipidemia: Secondary | ICD-10-CM | POA: Diagnosis not present

## 2019-04-01 DIAGNOSIS — I129 Hypertensive chronic kidney disease with stage 1 through stage 4 chronic kidney disease, or unspecified chronic kidney disease: Secondary | ICD-10-CM

## 2019-04-01 LAB — LIPID PANEL
CHOL/HDL RATIO: 4.5 (calc) (ref <5.0)
Cholesterol: 143 mg/dL (ref <200)
HDL: 32 mg/dL — ABNORMAL LOW (ref >=50)
LDL Cholesterol (Calc): 78 mg/dL (calc)
NON-HDL CHOLESTEROL (CALC): 111 mg/dL (ref <130)
Triglycerides: 249 mg/dL — ABNORMAL HIGH (ref <150)

## 2019-04-01 LAB — COMPLETE METABOLIC PANEL WITH GFR
AG RATIO: 1.4 (calc) (ref 1.0–2.5)
ALKALINE PHOSPHATASE (APISO): 127 U/L (ref 37–153)
ALT: 32 U/L — AB (ref 6–29)
AST: 25 U/L (ref 10–35)
Albumin: 4.2 g/dL (ref 3.6–5.1)
BILIRUBIN TOTAL: 0.7 mg/dL (ref 0.2–1.2)
BUN/Creatinine Ratio: 19 (calc) (ref 6–22)
BUN: 19 mg/dL (ref 7–25)
CALCIUM: 9.9 mg/dL (ref 8.6–10.4)
CHLORIDE: 103 mmol/L (ref 98–110)
CO2: 31 mmol/L (ref 20–32)
Creat: 1.02 mg/dL — ABNORMAL HIGH (ref 0.50–0.99)
GFR, EST NON AFRICAN AMERICAN: 59 mL/min/{1.73_m2} — AB (ref >=60)
GFR, Est African American: 68 mL/min/{1.73_m2} (ref >=60)
GLOBULIN: 2.9 g/dL (ref 1.9–3.7)
Glucose, Bld: 90 mg/dL (ref 65–99)
POTASSIUM: 4.7 mmol/L (ref 3.5–5.3)
SODIUM: 140 mmol/L (ref 135–146)
Total Protein: 7.1 g/dL (ref 6.1–8.1)

## 2019-04-01 LAB — HEMOGLOBIN A1C
EAG (MMOL/L): 7.6 (calc)
HEMOGLOBIN A1C: 6.4 %{Hb} — AB (ref <5.7)
MEAN PLASMA GLUCOSE: 137 (calc)

## 2019-04-01 LAB — TSH: TSH: 4.22 mIU/L (ref 0.40–4.50)

## 2019-04-01 LAB — T4, FREE: Free T4: 1.1 ng/dL (ref 0.8–1.8)

## 2019-04-01 MED ORDER — BASAGLAR KWIKPEN 100 UNIT/ML ~~LOC~~ SOPN: 60.0000 [IU] | PEN_INJECTOR | Freq: Every day | SUBCUTANEOUS | 3 refills | Status: DC

## 2019-04-01 NOTE — Progress Notes (Signed)
04/01/2019, 5:05 PM                                                    Endocrinology Telehealth Visit Follow up Note -During COVID -19 Pandemic  This visit type was conducted due to national recommendations for restrictions regarding the COVID-19 Pandemic  in an effort to limit this patient's exposure and mitigate transmission of the corona virus.  Due to his co-morbid illnesses, Alyssa Fox is at  moderate to high risk for complications without adequate follow up.  This format is felt to be most appropriate for her at this time.  I connected with this patient on 04/01/2019   by telephone and verified that I am speaking with the correct person using two identifiers. Alyssa Fox, 08-05-1956. she has verbally consented to this visit. All issues noted in this document were discussed and addressed. The format was not optimal for physical exam.    Subjective:    [name], female    DOB: Apr 12, 1956.  Alyssa Fox is being engaged in telehealth in follow up for management of currently uncontrolled symptomatic 2 diabetes, hyperlipidemia, hypertension, obesity. PMD:   Alyssa Speak, MD.   Past Medical History:  Diagnosis Date  . DM type 2 causing vascular disease (HCC)   . Hypertension   . MI (myocardial infarction) [facility]    Past Surgical History:  Procedure Laterality Date  . CHOLECYSTECTOMY    . CORONARY ANGIOPLASTY WITH STENT PLACEMENT    . TUBAL LIGATION     Social History   Socioeconomic History  . Marital status: Married    Spouse name: Not on file  . Number of children: Not on file  . Years of education: Not on file  . Highest education level: Not on file  Occupational History  . Not on file  Social Needs  . Financial resource strain: Not on file  . Food insecurity:    Worry: Not on file    Inability: Not on file  . Transportation needs:    Medical: Not on file    Non-medical: Not on file  Tobacco  Use  . Smoking status: Never Smoker  . Smokeless tobacco: Never Used  Substance and Sexual Activity  . Alcohol use: No  . Drug use: No  . Sexual activity: Not on file  Lifestyle  . Physical activity:    Days per week: Not on file    Minutes per session: Not on file  . Stress: Not on file  Relationships  . Social connections:    Talks on phone: Not on file    Gets together: Not on file    Attends religious service: Not on file    Active member of club or organization: Not on file    Attends meetings of clubs or organizations: Not on file    Relationship status: Not on file  Other Topics Concern  . Not on file  Social History Narrative  . Not on file   Outpatient Encounter Medications as of 04/01/2019  Medication Sig  . allopurinol (ZYLOPRIM) 100 MG tablet every morning.  Marland Kitchen aspirin EC 81 MG tablet Take by mouth daily.  Marland Kitchen atorvastatin (LIPITOR) 40 MG tablet Take 40 mg by mouth daily.  Marland Kitchen  gabapentin (NEURONTIN) 100 MG capsule Take 1 capsule (100 mg total) by mouth 3 (three) times daily.  . Insulin Glargine (BASAGLAR KWIKPEN) 100 UNIT/ML SOPN Inject 0.6 mLs (60 Units total) into the skin at bedtime.  . liraglutide (VICTOZA) 18 MG/3ML SOPN Inject 1.8 mg into the skin at bedtime.  Marland Kitchen lisinopril (PRINIVIL,ZESTRIL) 5 MG tablet Take 5 mg by mouth daily.  . meloxicam (MOBIC) 15 MG tablet Take 15 mg by mouth daily.  . metFORMIN (GLUCOPHAGE-XR) 750 MG 24 hr tablet Take 750 mg by mouth daily with breakfast.  . nebivolol (BYSTOLIC) 5 MG tablet Take 5 mg by mouth daily.  . nitroGLYCERIN (NITROSTAT) 0.4 MG SL tablet Place 0.4 mg under the tongue every 5 (five) minutes as needed for chest pain.  . ticagrelor (BRILINTA) 90 MG TABS tablet Take by mouth 2 (two) times daily.  . [DISCONTINUED] Insulin Glargine (BASAGLAR KWIKPEN) 100 UNIT/ML SOPN Inject 0.7 mLs (70 Units total) into the skin at bedtime.   No facility-administered encounter medications on file as of 04/01/2019.      ALLERGIES: Allergies  Allergen Reactions  . Latex     VACCINATION STATUS:  There is no immunization history on file for this patient.  Diabetes  She presents for her follow-up diabetic visit. She has type 2 diabetes mellitus. Onset time: She was diagnosed at approximate age of 47 years. Her disease course has been improving. There are no hypoglycemic associated symptoms. Pertinent negatives for hypoglycemia include no confusion, headaches, pallor or seizures. Associated symptoms include polydipsia and polyuria. Pertinent negatives for diabetes include no chest pain and no polyphagia. There are no hypoglycemic complications. Symptoms are improving. Diabetic complications include heart disease. (She is status post 3 stent placement for coronary artery disease and December 2015-about the time of her diagnosis with diabetes.) Risk factors for coronary artery disease include dyslipidemia, diabetes mellitus, family history, obesity, hypertension, post-menopausal and sedentary lifestyle. Current diabetic treatment includes insulin injections and oral agent (monotherapy) (She is currently on Basaglar 40 units nightly, Victoza 1.2 mg daily, and metformin 750 mg p.o. daily.). Her weight is stable. She is following a generally unhealthy diet. When asked about meal planning, she reported none. She has not had a previous visit with a dietitian. She rarely participates in exercise. Her breakfast blood glucose range is generally 140-180 mg/dl. Her bedtime blood glucose range is generally 140-180 mg/dl. Her overall blood glucose range is 140-180 mg/dl. An ACE inhibitor/angiotensin II receptor blocker is being taken. Eye exam is current.  Hyperlipidemia  This is a chronic problem. The current episode started more than 1 year ago. Exacerbating diseases include diabetes and obesity. Pertinent negatives include no chest pain, myalgias or shortness of breath. Current antihyperlipidemic treatment includes statins. Risk  factors for coronary artery disease include hypertension, dyslipidemia, a sedentary lifestyle, post-menopausal, diabetes mellitus and obesity.  Hypertension  This is a chronic problem. The current episode started more than 1 year ago. Pertinent negatives include no chest pain, headaches, palpitations or shortness of breath. Risk factors for coronary artery disease include dyslipidemia, diabetes mellitus, family history, post-menopausal state, obesity and sedentary lifestyle. Past treatments include ACE inhibitors.   Review of system: Limited as above.    Objective:    There were no vitals taken for this visit.  Wt Readings from Last 3 Encounters:  12/01/18 243 lb (110.2 kg)  08/27/18 245 lb (111.1 kg)  06/26/18 239 lb (108.4 kg)      Recent Results (from the past 2160 hour(s))  Hemoglobin A1c  Status: Abnormal   Collection Time: 03/31/19  7:39 AM  Result Value Ref Range   Hgb A1c MFr Bld 6.4 (H) <5.7 % of total Hgb    Comment: For someone without known diabetes, a hemoglobin  A1c value between 5.7% and 6.4% is consistent with prediabetes and should be confirmed with a  follow-up test. . For someone with known diabetes, a value <7% indicates that their diabetes is well controlled. A1c targets should be individualized based on duration of diabetes, age, comorbid conditions, and other considerations. . This assay result is consistent with an increased risk of diabetes. . Currently, no consensus exists regarding use of hemoglobin A1c for diagnosis of diabetes for children. .    Mean Plasma Glucose 137 (calc)   eAG (mmol/L) 7.6 (calc)  COMPLETE METABOLIC PANEL WITH GFR     Status: Abnormal   Collection Time: 03/31/19  7:39 AM  Result Value Ref Range   Glucose, Bld 90 65 - 99 mg/dL    Comment: .            Fasting reference interval .    BUN 19 7 - 25 mg/dL   Creat 1.02 (H) 7.25 - 0.99 mg/dL    Comment: For patients >36 years of age, the reference limit for  Creatinine is approximately 13% higher for people identified as African-American. .    GFR, Est Non African American 59 (L) > OR = 60 mL/min/1.43m2   GFR, Est African American 68 > OR = 60 mL/min/1.69m2   BUN/Creatinine Ratio 19 6 - 22 (calc)   Sodium 140 135 - 146 mmol/L   Potassium 4.7 3.5 - 5.3 mmol/L   Chloride 103 98 - 110 mmol/L   CO2 31 20 - 32 mmol/L   Calcium 9.9 8.6 - 10.4 mg/dL   Total Protein 7.1 6.1 - 8.1 g/dL   Albumin 4.2 3.6 - 5.1 g/dL   Globulin 2.9 1.9 - 3.7 g/dL (calc)   AG Ratio 1.4 1.0 - 2.5 (calc)   Total Bilirubin 0.7 0.2 - 1.2 mg/dL   Alkaline phosphatase (APISO) 127 37 - 153 U/L   AST 25 10 - 35 U/L   ALT 32 (H) 6 - 29 U/L  Lipid panel     Status: Abnormal   Collection Time: 03/31/19  7:39 AM  Result Value Ref Range   Cholesterol 143 <200 mg/dL   HDL 32 (L) > OR = 50 mg/dL   Triglycerides 366 (H) <150 mg/dL    Comment: . If a non-fasting specimen was collected, consider repeat triglyceride testing on a fasting specimen if clinically indicated.  Perry Mount et al. J. of Clin. Lipidol. 2015;9:129-169. Marland Kitchen    LDL Cholesterol (Calc) 78 mg/dL (calc)    Comment: Reference range: <100 . Desirable range <100 mg/dL for primary prevention;   <70 mg/dL for patients with CHD or diabetic patients  with > or = 2 CHD risk factors. Marland Kitchen LDL-C is now calculated using the Martin-Hopkins  calculation, which is a validated novel method providing  better accuracy than the Friedewald equation in the  estimation of LDL-C.  Horald Pollen et al. Lenox Ahr. 4403;474(25): 2061-2068  (http://education.[facility].com/faq/FAQ164)    Total CHOL/HDL Ratio 4.5 <5.0 (calc)   Non-HDL Cholesterol (Calc) 111 <130 mg/dL (calc)    Comment: For patients with diabetes plus 1 major ASCVD risk  factor, treating to a non-HDL-C goal of <100 mg/dL  (LDL-C of <95 mg/dL) is considered a therapeutic  option.   TSH     Status:  None   Collection Time: 03/31/19  7:39 AM  Result Value Ref Range    TSH 4.22 0.40 - 4.50 mIU/L  T4, free     Status: None   Collection Time: 03/31/19  7:39 AM  Result Value Ref Range   Free T4 1.1 0.8 - 1.8 ng/dL      Assessment & Plan:   1. Type 2 diabetes mellitus with stage 3 chronic kidney disease, with long-term current use of insulin (HCC)   - Alyssa Singeratricia D Milburn has currently uncontrolled symptomatic type 2 DM since 63 years of age. -Her previsit labs show significant improvement in her A1c to 6.4%, progressively improving from 9%.    -Ports significantly improved glycemic profile fasting ranging from 80-174.    - Recent labs reviewed, stage 3 renal insufficiency.  -her diabetes is complicated by coronary artery disease which required stent placement in 2015, renal insufficiency, obesity/sedentary life and Alyssa Fox remains at a high risk for more acute and chronic complications which include CAD, CVA, CKD, retinopathy, and neuropathy. These are all discussed in detail with the patient.  - I have counseled her on diet management and weight loss, by adopting a carbohydrate restricted/protein rich diet.  - Patient admits there is a room for improvement in her diet and drink choices. -  Suggestion is made for her to avoid simple carbohydrates  from her diet including Cakes, Sweet Desserts / Pastries, Ice Cream, Soda (diet and regular), Sweet Tea, Candies, Chips, Cookies, Store Bought Juices, Alcohol in Excess of  1-2 drinks a day, Artificial Sweeteners, and "Sugar-free" Products. This will help patient to have stable blood glucose profile and potentially avoid unintended weight gain.   - I encouraged her to switch to  unprocessed or minimally processed complex starch and increased protein intake (animal or plant source), fruits, and vegetables.  - she is advised to stick to a routine mealtimes to eat 3 meals  a day and avoid unnecessary snacks ( to snack only to correct hypoglycemia).   - I have approached her with the following  individualized plan to manage diabetes and patient agrees:   -Near target glycemic profile and A1c of 6.4%.  She is advised to lower her Basaglar to 60 units nightly,  associated with strict monitoring of blood glucose 2 times a day. -She is advised to continue Victoza 1.8 mg subcutaneously daily,  continue metformin 750 mg ER daily after breakfast.   - Patient is warned not to take insulin without proper monitoring per orders.  -Patient is encouraged to call clinic for blood glucose levels less than 70 or above 300 mg /dl.  -Patient is not a candidate for SGLT2 inhibitors, nor full dose of metformin due to CKD.   2) BP/HTN: she is advised to home monitor blood pressure and report if > 140/90 on 2 separate readings.   She is advised to continue her current blood pressure medications including lisinopril 5 mg p.o. daily.    3) Lipids/HPL:   she does not have recent lipid panel to review.  She is advised to continue atorvastatin 40 mg p.o. nightly.  She will have fasting lipid panel before her next visit.   5) Chronic Care/Health Maintenance:  -she  is on ACEI/ARB and Statin medications and  is encouraged to continue to follow up with Ophthalmology, Dentist,  Podiatrist at least yearly or according to recommendations, and advised to  stay away from smoking. I have recommended yearly flu vaccine and pneumonia vaccination at least  every 5 years; moderate intensity exercise for up to 150 minutes weekly; and  sleep for at least 7 hours a day. -Foot exam is significant for peripheral neuropathy, will refill her gabapentin.  - I advised patient to maintain close follow up with Alyssa Speak, MD for primary care needs. - Patient Care Time Today:  25 min, of which >50% was spent in reviewing her  current and  previous labs/studies, previous treatments, and medications doses and developing a plan for long-term care based on the latest recommendations for standards of care.  Alyssa Fox  participated in the discussions, expressed understanding, and voiced agreement with the above plans.  All questions were answered to her satisfaction. she is encouraged to contact clinic should she have any questions or concerns prior to her return visit.    Follow up plan: - Return in about 4 months (around 08/01/2019) for Meter, and Logs.  Marquis Lunch, MD [facility] [address] [place], [place] [address] Phone: [phone]  Fax: [phone]    04/01/2019, 5:05 PM  This note was partially dictated with voice recognition software. Similar sounding words can be transcribed inadequately or may not  be corrected upon review.

## 2019-04-06 ENCOUNTER — Other Ambulatory Visit: Payer: Self-pay

## 2019-04-06 MED ORDER — GABAPENTIN 100 MG PO CAPS: 100.0000 mg | ORAL_CAPSULE | Freq: Three times a day (TID) | ORAL | 3 refills | Status: DC

## 2019-06-01 ENCOUNTER — Telehealth: Payer: Self-pay | Admitting: "Endocrinology

## 2019-06-01 MED ORDER — INSULIN GLARGINE 100 UNIT/ML SOLOSTAR PEN: 60.0000 [IU] | PEN_INJECTOR | Freq: Every day | SUBCUTANEOUS | 2 refills | Status: DC

## 2019-06-01 NOTE — Telephone Encounter (Signed)
Pt.notified

## 2019-06-01 NOTE — Telephone Encounter (Signed)
She can get lantus same dose.

## 2019-06-01 NOTE — Telephone Encounter (Signed)
Pt left VM that as of July 1st, 2020 insurance will not cover her Insulin Glargine (BASAGLAR KWIKPEN) 100 UNIT/ML SOPN and she will run out before her next appt. They told her they do cover Lantus. Please Advise

## 2019-07-28 ENCOUNTER — Telehealth: Payer: Self-pay | Admitting: "Endocrinology

## 2019-07-28 NOTE — Telephone Encounter (Signed)
Patient would like to know if an " LBL CHOLESTEROL " could be added to her lab orders. She said this is the only thing she needs for the year for the incentive from work. She said if this can not be done, she will go another route. Please Advise. She is going tomorrow for blood work for Dr Dorris Fetch

## 2019-07-28 NOTE — Telephone Encounter (Signed)
Pt notified she has already had this done in April.

## 2019-07-30 LAB — COMPLETE METABOLIC PANEL WITH GFR
AG Ratio: 1.6 (calc) (ref 1.0–2.5)
ALT: 24 U/L (ref 6–29)
AST: 17 U/L (ref 10–35)
Albumin: 4.2 g/dL (ref 3.6–5.1)
Alkaline phosphatase (APISO): 135 U/L (ref 37–153)
BUN/Creatinine Ratio: 17 (calc) (ref 6–22)
BUN: 18 mg/dL (ref 7–25)
CO2: 30 mmol/L (ref 20–32)
Calcium: 9.7 mg/dL (ref 8.6–10.4)
Chloride: 105 mmol/L (ref 98–110)
Creat: 1.09 mg/dL — ABNORMAL HIGH (ref 0.50–0.99)
GFR, Est African American: 63 mL/min/{1.73_m2} (ref >=60)
GFR, Est Non African American: 54 mL/min/{1.73_m2} — ABNORMAL LOW (ref >=60)
Globulin: 2.7 g/dL (calc) (ref 1.9–3.7)
Glucose, Bld: 96 mg/dL (ref 65–99)
Potassium: 4.2 mmol/L (ref 3.5–5.3)
Sodium: 141 mmol/L (ref 135–146)
Total Bilirubin: 0.8 mg/dL (ref 0.2–1.2)
Total Protein: 6.9 g/dL (ref 6.1–8.1)

## 2019-07-30 LAB — HEMOGLOBIN A1C
Hgb A1c MFr Bld: 6.1 % of total Hgb — ABNORMAL HIGH (ref <5.7)
Mean Plasma Glucose: 128 (calc)
eAG (mmol/L): 7.1 (calc)

## 2019-08-03 ENCOUNTER — Other Ambulatory Visit: Payer: Self-pay

## 2019-08-03 ENCOUNTER — Ambulatory Visit (INDEPENDENT_AMBULATORY_CARE_PROVIDER_SITE_OTHER): Payer: 59 | Admitting: "Endocrinology

## 2019-08-03 ENCOUNTER — Encounter: Payer: Self-pay | Admitting: "Endocrinology

## 2019-08-03 DIAGNOSIS — I10 Essential (primary) hypertension: Secondary | ICD-10-CM

## 2019-08-03 DIAGNOSIS — E1122 Type 2 diabetes mellitus with diabetic chronic kidney disease: Secondary | ICD-10-CM | POA: Diagnosis not present

## 2019-08-03 DIAGNOSIS — N183 Chronic kidney disease, stage 3 unspecified: Secondary | ICD-10-CM

## 2019-08-03 DIAGNOSIS — Z794 Long term (current) use of insulin: Secondary | ICD-10-CM

## 2019-08-03 DIAGNOSIS — E782 Mixed hyperlipidemia: Secondary | ICD-10-CM

## 2019-08-03 MED ORDER — INSULIN GLARGINE 100 UNIT/ML SOLOSTAR PEN: 50.0000 [IU] | PEN_INJECTOR | Freq: Every day | SUBCUTANEOUS | 2 refills | Status: DC

## 2019-08-03 NOTE — Progress Notes (Signed)
08/03/2019, 5:52 PM                                                    Endocrinology Telehealth Visit Follow up Note -During COVID -19 Pandemic  This visit type was conducted due to national recommendations for restrictions regarding the COVID-19 Pandemic  in an effort to limit this patient's exposure and mitigate transmission of the corona virus.  Due to his co-morbid illnesses, Alyssa Fox is at  moderate to high risk for complications without adequate follow up.  This format is felt to be most appropriate for her at this time.  I connected with this patient on 08/03/2019   by telephone and verified that I am speaking with the correct person using two identifiers. Alyssa Fox, Jul 22, 1956. she has verbally consented to this visit. All issues noted in this document were discussed and addressed. The format was not optimal for physical exam.    Subjective:    [name], female    DOB: Jul 22, 1956.  Alyssa Fox is being engaged in telehealth in follow up for management of currently uncontrolled symptomatic 2 diabetes, hyperlipidemia, hypertension, obesity. PMD:   Jonetta SpeakBaveja, Namrita, MD.   Past Medical History:  Diagnosis Date  . DM type 2 causing vascular disease (HCC)   . Hypertension   . MI (myocardial infarction) [facility](HCC)    Past Surgical History:  Procedure Laterality Date  . CHOLECYSTECTOMY    . CORONARY ANGIOPLASTY WITH STENT PLACEMENT    . TUBAL LIGATION     Social History   Socioeconomic History  . Marital status: Married    Spouse name: Not on file  . Number of children: Not on file  . Years of education: Not on file  . Highest education level: Not on file  Occupational History  . Not on file  Social Needs  . Financial resource strain: Not on file  . Food insecurity    Worry: Not on file    Inability: Not on file  . Transportation needs    Medical: Not on file    Non-medical: Not on file  Tobacco Use   . Smoking status: Never Smoker  . Smokeless tobacco: Never Used  Substance and Sexual Activity  . Alcohol use: No  . Drug use: No  . Sexual activity: Not on file  Lifestyle  . Physical activity    Days per week: Not on file    Minutes per session: Not on file  . Stress: Not on file  Relationships  . Social Musicianconnections    Talks on phone: Not on file    Gets together: Not on file    Attends religious service: Not on file    Active member of club or organization: Not on file    Attends meetings of clubs or organizations: Not on file    Relationship status: Not on file  Other Topics Concern  . Not on file  Social History Narrative  . Not on file   Outpatient Encounter Medications as of 08/03/2019  Medication Sig  . allopurinol (ZYLOPRIM) 100 MG tablet every morning.  Marland Kitchen. aspirin EC 81 MG tablet Take by mouth daily.  Marland Kitchen. atorvastatin (LIPITOR) 40 MG tablet Take 40 mg by mouth daily.  .Marland Kitchen  gabapentin (NEURONTIN) 100 MG capsule Take 1 capsule (100 mg total) by mouth 3 (three) times daily.  . Insulin Glargine (LANTUS) 100 UNIT/ML Solostar Pen Inject 50 Units into the skin at bedtime.  . liraglutide (VICTOZA) 18 MG/3ML SOPN Inject 1.8 mg into the skin at bedtime.  Marland Kitchen lisinopril (PRINIVIL,ZESTRIL) 5 MG tablet Take 5 mg by mouth daily.  . meloxicam (MOBIC) 15 MG tablet Take 15 mg by mouth daily.  . metFORMIN (GLUCOPHAGE-XR) 750 MG 24 hr tablet Take 750 mg by mouth daily with breakfast.  . nebivolol (BYSTOLIC) 5 MG tablet Take 5 mg by mouth daily.  . nitroGLYCERIN (NITROSTAT) 0.4 MG SL tablet Place 0.4 mg under the tongue every 5 (five) minutes as needed for chest pain.  . ticagrelor (BRILINTA) 90 MG TABS tablet Take by mouth 2 (two) times daily.  . [DISCONTINUED] Insulin Glargine (BASAGLAR KWIKPEN) 100 UNIT/ML SOPN Inject 0.6 mLs (60 Units total) into the skin at bedtime.  . [DISCONTINUED] Insulin Glargine (LANTUS) 100 UNIT/ML Solostar Pen Inject 60 Units into the skin daily.   No  facility-administered encounter medications on file as of 08/03/2019.     ALLERGIES: Allergies  Allergen Reactions  . Latex     VACCINATION STATUS:  There is no immunization history on file for this patient.  Diabetes She presents for her follow-up diabetic visit. She has type 2 diabetes mellitus. Onset time: She was diagnosed at approximate age of 32 years. Her disease course has been improving. There are no hypoglycemic associated symptoms. Pertinent negatives for hypoglycemia include no confusion, headaches, pallor or seizures. Pertinent negatives for diabetes include no chest pain, no polydipsia, no polyphagia and no polyuria. There are no hypoglycemic complications. Symptoms are improving. Diabetic complications include heart disease. (She is status post 3 stent placement for coronary artery disease and December 2015-about the time of her diagnosis with diabetes.) Risk factors for coronary artery disease include dyslipidemia, diabetes mellitus, family history, obesity, hypertension, post-menopausal and sedentary lifestyle. Current diabetic treatment includes insulin injections and oral agent (monotherapy) (She is currently on Basaglar 40 units nightly, Victoza 1.2 mg daily, and metformin 750 mg p.o. daily.). Her weight is stable. She is following a generally unhealthy diet. When asked about meal planning, she reported none. She has not had a previous visit with a dietitian. She rarely participates in exercise. Her breakfast blood glucose range is generally 130-140 mg/dl. Her overall blood glucose range is 130-140 mg/dl. An ACE inhibitor/angiotensin II receptor blocker is being taken. Eye exam is current.  Hyperlipidemia This is a chronic problem. The current episode started more than 1 year ago. Exacerbating diseases include diabetes and obesity. Pertinent negatives include no chest pain, myalgias or shortness of breath. Current antihyperlipidemic treatment includes statins. Risk factors for  coronary artery disease include hypertension, dyslipidemia, a sedentary lifestyle, post-menopausal, diabetes mellitus and obesity.  Hypertension This is a chronic problem. The current episode started more than 1 year ago. Pertinent negatives include no chest pain, headaches, palpitations or shortness of breath. Risk factors for coronary artery disease include dyslipidemia, diabetes mellitus, family history, post-menopausal state, obesity and sedentary lifestyle. Past treatments include ACE inhibitors.   Review of system: Limited as above.    Objective:    There were no vitals taken for this visit.  Wt Readings from Last 3 Encounters:  12/01/18 243 lb (110.2 kg)  08/27/18 245 lb (111.1 kg)  06/26/18 239 lb (108.4 kg)      Recent Results (from the past 2160 hour(s))  Hemoglobin A1c  Status: Abnormal   Collection Time: 07/29/19  8:27 AM  Result Value Ref Range   Hgb A1c MFr Bld 6.1 (H) <5.7 % of total Hgb    Comment: For someone without known diabetes, a hemoglobin  A1c value between 5.7% and 6.4% is consistent with prediabetes and should be confirmed with a  follow-up test. . For someone with known diabetes, a value <7% indicates that their diabetes is well controlled. A1c targets should be individualized based on duration of diabetes, age, comorbid conditions, and other considerations. . This assay result is consistent with an increased risk of diabetes. . Currently, no consensus exists regarding use of hemoglobin A1c for diagnosis of diabetes for children. .    Mean Plasma Glucose 128 (calc)   eAG (mmol/L) 7.1 (calc)  COMPLETE METABOLIC PANEL WITH GFR     Status: Abnormal   Collection Time: 07/29/19  8:27 AM  Result Value Ref Range   Glucose, Bld 96 65 - 99 mg/dL    Comment: .            Fasting reference interval .    BUN 18 7 - 25 mg/dL   Creat 1.191.09 (H) 1.470.50 - 0.99 mg/dL    Comment: For patients >63 years of age, the reference limit for Creatinine is  approximately 13% higher for people identified as African-American. .    GFR, Est Non African American 54 (L) > OR = 60 mL/min/1.9373m2   GFR, Est African American 63 > OR = 60 mL/min/1.7873m2   BUN/Creatinine Ratio 17 6 - 22 (calc)   Sodium 141 135 - 146 mmol/L   Potassium 4.2 3.5 - 5.3 mmol/L   Chloride 105 98 - 110 mmol/L   CO2 30 20 - 32 mmol/L   Calcium 9.7 8.6 - 10.4 mg/dL   Total Protein 6.9 6.1 - 8.1 g/dL   Albumin 4.2 3.6 - 5.1 g/dL   Globulin 2.7 1.9 - 3.7 g/dL (calc)   AG Ratio 1.6 1.0 - 2.5 (calc)   Total Bilirubin 0.8 0.2 - 1.2 mg/dL   Alkaline phosphatase (APISO) 135 37 - 153 U/L   AST 17 10 - 35 U/L   ALT 24 6 - 29 U/L      Assessment & Plan:   1. Type 2 diabetes mellitus with stage 3 chronic kidney disease, with long-term current use of insulin (HCC)   - Alyssa Fox has currently uncontrolled symptomatic type 2 DM since 63 years of age. -Her previsit labs show significant improvement in her A1c to 6.1%,  progressively improving from 9%.    - Recent labs reviewed, stage 3 renal insufficiency.  -her diabetes is complicated by coronary artery disease which required stent placement in 2015, renal insufficiency, obesity/sedentary life and Alyssa Fox remains at a high risk for more acute and chronic complications which include CAD, CVA, CKD, retinopathy, and neuropathy. These are all discussed in detail with the patient.  - I have counseled her on diet management and weight loss, by adopting a carbohydrate restricted/protein rich diet.  - she  admits there is a room for improvement in her diet and drink choices. -  Suggestion is made for her to avoid simple carbohydrates  from her diet including Cakes, Sweet Desserts / Pastries, Ice Cream, Soda (diet and regular), Sweet Tea, Candies, Chips, Cookies, Sweet Pastries,  Store Bought Juices, Alcohol in Excess of  1-2 drinks a day, Artificial Sweeteners, Coffee Creamer, and "Sugar-free" Products. This will help  patient to have stable  blood glucose profile and potentially avoid unintended weight gain.  - I encouraged her to switch to  unprocessed or minimally processed complex starch and increased protein intake (animal or plant source), fruits, and vegetables.  - she is advised to stick to a routine mealtimes to eat 3 meals  a day and avoid unnecessary snacks ( to snack only to correct hypoglycemia).   - I have approached her with the following individualized plan to manage diabetes and patient agrees:   -She presents with near target glycemic profile and A1c of 6.1%.  She is advised to lower her Basaglar to 50 units nightly,  associated with strict monitoring of blood glucose 2 times a day. -She is advised to continue Victoza 1.8 mg subcutaneously daily,  continue metformin 750 mg ER daily after breakfast.   - Patient is warned not to take insulin without proper monitoring per orders.  -Patient is encouraged to call clinic for blood glucose levels less than 70 or above 200 mg /dl.  2) BP/HTN: she is advised to home monitor blood pressure and report if > 140/90 on 2 separate readings.   She is advised to continue her current blood pressure medications including lisinopril 5 mg p.o. daily.    3) Lipids/HPL:   she does not have recent lipid panel to review.  She is advised to continue atorvastatin 40 mg p.o. nightly.    She will have fasting lipid panel before her next visit.   5) Chronic Care/Health Maintenance:  -she  is on ACEI/ARB and Statin medications and  is encouraged to continue to follow up with Ophthalmology, Dentist,  Podiatrist at least yearly or according to recommendations, and advised to  stay away from smoking. I have recommended yearly flu vaccine and pneumonia vaccination at least every 5 years; moderate intensity exercise for up to 150 minutes weekly; and  sleep for at least 7 hours a day. -Foot exam is significant for peripheral neuropathy, will refill her gabapentin.  - I  advised patient to maintain close follow up with Jonetta Speak, MD for primary care needs.  - Patient Care Time Today:  25 min, of which >50% was spent in  counseling and the rest reviewing her  current and  previous labs/studies, previous treatments, her blood glucose readings, and medications' doses and developing a plan for long-term care based on the latest recommendations for standards of care.   Alyssa Singer participated in the discussions, expressed understanding, and voiced agreement with the above plans.  All questions were answered to her satisfaction. she is encouraged to contact clinic should she have any questions or concerns prior to her return visit.   Follow up plan: - Return in about 4 months (around 12/03/2019) for Bring Meter and Logs- A1c in Office, Include 6 log sheets.  Marquis Lunch, MD [facility] [address] [place], [place] [address] Phone: [phone]  Fax: [phone]    08/03/2019, 5:52 PM  This note was partially dictated with voice recognition software. Similar sounding words can be transcribed inadequately or may not  be corrected upon review.

## 2019-10-20 ENCOUNTER — Telehealth: Payer: Self-pay | Admitting: "Endocrinology

## 2019-10-20 MED ORDER — INSULIN GLARGINE 100 UNIT/ML SOLOSTAR PEN: 50.0000 [IU] | PEN_INJECTOR | Freq: Every day | SUBCUTANEOUS | 1 refills | Status: DC

## 2019-10-20 NOTE — Telephone Encounter (Signed)
Patient would like her lantus to go to [facility] and for 3 month supply, as it is $35.00 for 3 months.

## 2019-10-20 NOTE — Telephone Encounter (Signed)
Rx sent 

## 2019-11-16 LAB — HEMOGLOBIN A1C: Hemoglobin A1C: 6.3

## 2019-12-07 ENCOUNTER — Ambulatory Visit (INDEPENDENT_AMBULATORY_CARE_PROVIDER_SITE_OTHER): Payer: 59 | Admitting: "Endocrinology

## 2019-12-07 ENCOUNTER — Encounter: Payer: Self-pay | Admitting: "Endocrinology

## 2019-12-07 DIAGNOSIS — E782 Mixed hyperlipidemia: Secondary | ICD-10-CM | POA: Diagnosis not present

## 2019-12-07 DIAGNOSIS — I10 Essential (primary) hypertension: Secondary | ICD-10-CM

## 2019-12-07 DIAGNOSIS — E1121 Type 2 diabetes mellitus with diabetic nephropathy: Secondary | ICD-10-CM | POA: Diagnosis not present

## 2019-12-07 DIAGNOSIS — N1831 Chronic kidney disease, stage 3a: Secondary | ICD-10-CM | POA: Diagnosis not present

## 2019-12-07 DIAGNOSIS — Z794 Long term (current) use of insulin: Secondary | ICD-10-CM

## 2019-12-07 MED ORDER — LISINOPRIL 5 MG PO TABS: 5.0000 mg | ORAL_TABLET | Freq: Every day | ORAL | 1 refills | Status: DC

## 2019-12-07 MED ORDER — ATORVASTATIN CALCIUM 40 MG PO TABS: 40.0000 mg | ORAL_TABLET | Freq: Every day | ORAL | 3 refills | Status: DC

## 2019-12-07 NOTE — Progress Notes (Signed)
12/07/2019, 1:33 PM                                                    Endocrinology Telehealth Visit Follow up Note -During COVID -19 Pandemic  This visit type was conducted due to national recommendations for restrictions regarding the COVID-19 Pandemic  in an effort to limit this patient's exposure and mitigate transmission of the corona virus.  Due to his co-morbid illnesses, Alyssa Fox is at  moderate to high risk for complications without adequate follow up.  This format is felt to be most appropriate for her at this time.  I connected with this patient on 12/07/2019   by telephone and verified that I am speaking with the correct person using two identifiers. Alyssa Fox, Alyssa Fox, Alyssa Fox. she has verbally consented to this visit. All issues noted in this document were discussed and addressed. The format was not optimal for physical exam.    Subjective:    [name], female    DOB: [birth year].  Alyssa Fox is being engaged in telehealth in follow up for management of currently uncontrolled symptomatic 2 diabetes, hyperlipidemia, hypertension, obesity. PMD:   Theressa Stamps, MD.   Past Medical History:  Diagnosis Date  . DM type 2 causing vascular disease ([place])   . Hypertension   . MI (myocardial infarction) [facility])    Past Surgical History:  Procedure Laterality Date  . CHOLECYSTECTOMY    . CORONARY ANGIOPLASTY WITH STENT PLACEMENT    . TUBAL LIGATION     Social History   Socioeconomic History  . Marital status: Married    Spouse name: Not on file  . Number of children: Not on file  . Years of education: Not on file  . Highest education level: Not on file  Occupational History  . Not on file  Tobacco Use  . Smoking status: Never Smoker  . Smokeless tobacco: Never Used  Substance and Sexual Activity  . Alcohol use: No  . Drug use: No  . Sexual activity: Not on file  Other Topics Concern  . Not on file   Social History Narrative  . Not on file   Social Determinants of Health   Financial Resource Strain:   . Difficulty of Paying Living Expenses: Not on file  Food Insecurity:   . Worried About Charity fundraiser in the Last Year: Not on file  . Ran Out of Food in the Last Year: Not on file  Transportation Needs:   . Lack of Transportation (Medical): Not on file  . Lack of Transportation (Non-Medical): Not on file  Physical Activity:   . Days of Exercise per Week: Not on file  . Minutes of Exercise per Session: Not on file  Stress:   . Feeling of Stress : Not on file  Social Connections:   . Frequency of Communication with Friends and Family: Not on file  . Frequency of Social Gatherings with Friends and Family: Not on file  . Attends Religious Services: Not on file  . Active Member of Clubs or Organizations: Not on file  . Attends Archivist Meetings: Not on file  . Marital Status: Not on file   Outpatient Encounter Medications as of 12/07/2019  Medication Sig  . allopurinol (ZYLOPRIM) 100 MG tablet every morning.  Marland Kitchen aspirin EC 81 MG tablet Take by mouth daily.  Marland Kitchen atorvastatin (LIPITOR) 40 MG tablet Take 1 tablet (40 mg total) by mouth daily.  Marland Kitchen gabapentin (NEURONTIN) 100 MG capsule Take 1 capsule (100 mg total) by mouth 3 (three) times daily.  . Insulin Glargine (LANTUS) 100 UNIT/ML Solostar Pen Inject 50 Units into the skin at bedtime.  . liraglutide (VICTOZA) 18 MG/3ML SOPN Inject 1.8 mg into the skin at bedtime.  Marland Kitchen lisinopril (ZESTRIL) 5 MG tablet Take 1 tablet (5 mg total) by mouth daily.  . meloxicam (MOBIC) 15 MG tablet Take 15 mg by mouth daily.  . metFORMIN (GLUCOPHAGE-XR) 750 MG 24 hr tablet Take 750 mg by mouth daily with breakfast.  . nebivolol (BYSTOLIC) 5 MG tablet Take 5 mg by mouth daily.  . nitroGLYCERIN (NITROSTAT) 0.4 MG SL tablet Place 0.4 mg under the tongue every 5 (five) minutes as needed for chest pain.  . ticagrelor (BRILINTA) 90 MG TABS tablet  Take by mouth 2 (two) times daily.  . [DISCONTINUED] atorvastatin (LIPITOR) 40 MG tablet Take 40 mg by mouth daily.  . [DISCONTINUED] lisinopril (PRINIVIL,ZESTRIL) 5 MG tablet Take 5 mg by mouth daily.   No facility-administered encounter medications on file as of 12/07/2019.    Alyssa Fox  Allergen Reactions  . Latex     VACCINATION STATUS:  There is no immunization history on file for this patient.  Diabetes She presents for her follow-up diabetic visit. She has type 2 diabetes mellitus. Onset time: She was diagnosed at approximate age of 59 years. Her disease course has been stable. There are no hypoglycemic associated symptoms. Pertinent negatives for hypoglycemia include no confusion, headaches, pallor or seizures. Pertinent negatives for diabetes include no chest pain, no polydipsia, no polyphagia and no polyuria. There are no hypoglycemic complications. Symptoms are stable. Diabetic complications include heart disease. (She is status post 3 stent placement for coronary artery disease and December 2015-about the time of her diagnosis with diabetes.) Risk factors for coronary artery disease include dyslipidemia, diabetes mellitus, family history, obesity, hypertension, post-menopausal and sedentary lifestyle. Current diabetic treatment includes insulin injections and oral agent (monotherapy) (She is currently on Basaglar 40 units nightly, Victoza 1.2 mg daily, and metformin 750 mg p.o. daily.). Her weight is decreasing steadily. She has not had a previous visit with a dietitian. She rarely participates in exercise. Her breakfast blood glucose range is generally 130-140 mg/dl. Her overall blood glucose range is 130-140 mg/dl. An ACE inhibitor/angiotensin II receptor blocker is being taken. Eye exam is current.  Hyperlipidemia This is a chronic problem. The current episode started more than 1 year ago. Exacerbating diseases include diabetes and obesity. Pertinent negatives include no  chest pain, myalgias or shortness of breath. Current antihyperlipidemic treatment includes statins. Risk factors for coronary artery disease include hypertension, dyslipidemia, a sedentary lifestyle, post-menopausal, diabetes mellitus and obesity.  Hypertension This is a chronic problem. The current episode started more than 1 year ago. Pertinent negatives include no chest pain, headaches, palpitations or shortness of breath. Risk factors for coronary artery disease include dyslipidemia, diabetes mellitus, family history, post-menopausal state, obesity and sedentary lifestyle. Past treatments include ACE inhibitors.   Review of system: Limited as above.    Objective:    There were no vitals taken for this visit.  Wt Readings from Last 3 Encounters:  12/01/18 243 lb (110.2 kg)  08/27/18 245 lb (111.1 kg)  06/26/18 239  lb (108.4 kg)      Recent Results (from the past 2160 hour(s))  Hemoglobin A1c     Status: None   Collection Time: 12/14/Fox 12:00 AM  Result Value Ref Range   Hemoglobin A1C 6.3    Lipid Panel     Component Value Date/Time   CHOL 143 03/31/2019 0739   TRIG 249 (H) 03/31/2019 0739   HDL 32 (L) 03/31/2019 0739   CHOLHDL 4.5 03/31/2019 0739   LDLCALC 78 03/31/2019 0739      Assessment & Plan:   1. Type 2 diabetes mellitus with stage 3 chronic kidney disease, with long-term current use of insulin (HCC)   - Alyssa Fox has currently uncontrolled symptomatic type 2 DM since 64 years of age. -Her previsit labs show stable and controlled A1c of 6.3%, overall improving from 9%.   - Recent labs reviewed, stage 3 renal insufficiency.  -her diabetes is complicated by coronary artery disease which required stent placement in 2015, renal insufficiency, obesity/sedentary life and Alyssa Fox remains at a high risk for more acute and chronic complications which include CAD, CVA, CKD, retinopathy, and neuropathy. These are all discussed in detail with the  patient.  - I have counseled her on diet management and weight loss, by adopting a carbohydrate restricted/protein rich diet.  - she  admits there is a room for improvement in her diet and drink choices. -  Suggestion is made for her to avoid simple carbohydrates  from her diet including Cakes, Sweet Desserts / Pastries, Ice Cream, Soda (diet and regular), Sweet Tea, Candies, Chips, Cookies, Sweet Pastries,  Store Bought Juices, Alcohol in Excess of  1-2 drinks a day, Artificial Sweeteners, Coffee Creamer, and "Sugar-free" Products. This will help patient to have stable blood glucose profile and potentially avoid unintended weight gain.  - I encouraged her to switch to  unprocessed or minimally processed complex starch and increased protein intake (animal or plant source), fruits, and vegetables.  - she is advised to stick to a routine mealtimes to eat 3 meals  a day and avoid unnecessary snacks ( to snack only to correct hypoglycemia).   - I have approached her with the following individualized plan to manage diabetes and patient agrees:   -She reports near target glycemic profile, previsit A1c of 6.3%.   -She will continue to benefit from low-dose basal insulin.  She is advised to continue Lantus  50 units nightly,  associated with strict monitoring of blood glucose 2 times a day. -She is advised to continue Victoza 1.8 mg subcutaneously daily,  continue metformin 750 mg ER daily after breakfast.   - Patient is warned not to take insulin without proper monitoring per orders.  -Patient is encouraged to call clinic for blood glucose levels less than 70 or above 200 mg /dl.  2) BP/HTN: She has well-controlled blood pressure.   She is advised to continue her current blood pressure medications including lisinopril 5 mg p.o. daily.    3) Lipids/HPL: Her last lipid panel showed LDL of 78.  She will continue to benefit from statin therapy.  She is advised to continue Lipitor 40 mg p.o. daily  nightly.    5) Chronic Care/Health Maintenance:  -she  is on ACEI/ARB and Statin medications and  is encouraged to continue to follow up with Ophthalmology, Dentist,  Podiatrist at least yearly or according to recommendations, and advised to  stay away from smoking. I have recommended yearly flu vaccine and pneumonia vaccination  at least every 5 years; moderate intensity exercise for up to 150 minutes weekly; and  sleep for at least 7 hours a day. -Foot exam is significant for peripheral neuropathy, will refill her gabapentin.  - I advised patient to maintain close follow up with Alyssa Speak, MD for primary care needs.  - Time spent on this patient care encounter:  35 min, of which >50% was spent in  counseling and the rest reviewing her  current and  previous labs/studies ( including abstraction from other facilities),  previous treatments, her blood glucose readings, and medications' doses and developing a plan for long-term care based on the latest recommendations for standards of care; and documenting her care.  Alyssa Fox participated in the discussions, expressed understanding, and voiced agreement with the above plans.  All questions were answered to her satisfaction. she is encouraged to contact clinic should she have any questions or concerns prior to her return visit.   Follow up plan: - Return in about 4 months (around 04/05/2020) for Follow up with Pre-visit Labs, Next Visit A1c in Office.  Alyssa Lunch, MD [facility] [address] [place], [place] [address] Phone: [phone]  Fax: [phone]    12/07/2019, 1:33 PM  This note was partially dictated with voice recognition software. Similar sounding words can be transcribed inadequately or may not  be corrected upon review.

## 2020-01-27 ENCOUNTER — Other Ambulatory Visit: Payer: Self-pay | Admitting: "Endocrinology

## 2020-03-30 ENCOUNTER — Other Ambulatory Visit: Payer: Self-pay | Admitting: "Endocrinology

## 2020-04-07 ENCOUNTER — Encounter: Payer: Self-pay | Admitting: "Endocrinology

## 2020-04-07 ENCOUNTER — Other Ambulatory Visit: Payer: Self-pay

## 2020-04-07 ENCOUNTER — Ambulatory Visit (INDEPENDENT_AMBULATORY_CARE_PROVIDER_SITE_OTHER): Payer: 59 | Admitting: "Endocrinology

## 2020-04-07 VITALS — BP 109/71 | HR 67 | Ht 64.0 in | Wt 246.4 lb

## 2020-04-07 DIAGNOSIS — N1831 Chronic kidney disease, stage 3a: Secondary | ICD-10-CM

## 2020-04-07 DIAGNOSIS — Z794 Long term (current) use of insulin: Secondary | ICD-10-CM | POA: Diagnosis not present

## 2020-04-07 DIAGNOSIS — E1121 Type 2 diabetes mellitus with diabetic nephropathy: Secondary | ICD-10-CM

## 2020-04-07 DIAGNOSIS — I10 Essential (primary) hypertension: Secondary | ICD-10-CM

## 2020-04-07 DIAGNOSIS — E782 Mixed hyperlipidemia: Secondary | ICD-10-CM

## 2020-04-07 LAB — COMPLETE METABOLIC PANEL WITH GFR
AG Ratio: 1.4 (calc) (ref 1.0–2.5)
ALT: 18 U/L (ref 6–29)
AST: 15 U/L (ref 10–35)
Albumin: 4.2 g/dL (ref 3.6–5.1)
Alkaline phosphatase (APISO): 114 U/L (ref 37–153)
BUN/Creatinine Ratio: 20 (calc) (ref 6–22)
BUN: 20 mg/dL (ref 7–25)
CO2: 29 mmol/L (ref 20–32)
Calcium: 10.3 mg/dL (ref 8.6–10.4)
Chloride: 102 mmol/L (ref 98–110)
Creat: 1.02 mg/dL — ABNORMAL HIGH (ref 0.50–0.99)
GFR, Est African American: 68 mL/min/{1.73_m2} (ref >=60)
GFR, Est Non African American: 58 mL/min/{1.73_m2} — ABNORMAL LOW (ref >=60)
Globulin: 2.9 g/dL (calc) (ref 1.9–3.7)
Glucose, Bld: 127 mg/dL (ref 65–139)
Potassium: 4.8 mmol/L (ref 3.5–5.3)
Sodium: 139 mmol/L (ref 135–146)
Total Bilirubin: 0.9 mg/dL (ref 0.2–1.2)
Total Protein: 7.1 g/dL (ref 6.1–8.1)

## 2020-04-07 LAB — LIPID PANEL
Cholesterol: 155 mg/dL (ref <200)
HDL: 32 mg/dL — ABNORMAL LOW (ref >=50)
LDL Cholesterol (Calc): 86 mg/dL (calc)
Non-HDL Cholesterol (Calc): 123 mg/dL (calc) (ref <130)
Total CHOL/HDL Ratio: 4.8 (calc) (ref <5.0)
Triglycerides: 304 mg/dL — ABNORMAL HIGH (ref <150)

## 2020-04-07 LAB — MICROALBUMIN / CREATININE URINE RATIO
Creatinine, Urine: 169 mg/dL (ref 20–275)
Microalb Creat Ratio: 5 mcg/mg creat (ref <30)
Microalb, Ur: 0.9 mg/dL

## 2020-04-07 LAB — POCT GLYCOSYLATED HEMOGLOBIN (HGB A1C): Hemoglobin A1C: 6.7 % — AB (ref 4.0–5.6)

## 2020-04-07 MED ORDER — METFORMIN HCL ER 750 MG PO TB24: 750.0000 mg | ORAL_TABLET | Freq: Every day | ORAL | 1 refills | Status: DC

## 2020-04-07 MED ORDER — LIRAGLUTIDE 18 MG/3ML ~~LOC~~ SOPN: 1.8000 mg | PEN_INJECTOR | Freq: Every day | SUBCUTANEOUS | 1 refills | Status: DC

## 2020-04-07 MED ORDER — LANTUS SOLOSTAR 100 UNIT/ML ~~LOC~~ SOPN: PEN_INJECTOR | SUBCUTANEOUS | 1 refills | Status: DC

## 2020-04-07 NOTE — Progress Notes (Signed)
04/07/2020, 12:34 PM                 Endocrinology follow-up note   Subjective:    [name], female    DOB: 1956/02/11.  Alyssa Fox is being seen in follow-up  for management of currently uncontrolled symptomatic 2 diabetes, hyperlipidemia, hypertension, obesity. PMD:   Jonetta Speak, MD.   Past Medical History:  Diagnosis Date  . DM type 2 causing vascular disease (HCC)   . Hypertension   . MI (myocardial infarction) [facility])    Past Surgical History:  Procedure Laterality Date  . CHOLECYSTECTOMY    . CORONARY ANGIOPLASTY WITH STENT PLACEMENT    . TUBAL LIGATION     Social History   Socioeconomic History  . Marital status: Married    Spouse name: Not on file  . Number of children: Not on file  . Years of education: Not on file  . Highest education level: Not on file  Occupational History  . Not on file  Tobacco Use  . Smoking status: Never Smoker  . Smokeless tobacco: Never Used  Substance and Sexual Activity  . Alcohol use: No  . Drug use: No  . Sexual activity: Not on file  Other Topics Concern  . Not on file  Social History Narrative  . Not on file   Social Determinants of Health   Financial Resource Strain:   . Difficulty of Paying Living Expenses:   Food Insecurity:   . Worried About Programme researcher, broadcasting/film/video in the Last Year:   . Barista in the Last Year:   Transportation Needs:   . Freight forwarder (Medical):   Marland Kitchen Lack of Transportation (Non-Medical):   Physical Activity:   . Days of Exercise per Week:   . Minutes of Exercise per Session:   Stress:   . Feeling of Stress :   Social Connections:   . Frequency of Communication with Friends and Family:   . Frequency of Social Gatherings with Friends and Family:   . Attends Religious Services:   . Active Member of Clubs or Organizations:   . Attends Banker Meetings:   Marland Kitchen Marital Status:    Outpatient Encounter  Medications as of 04/07/2020  Medication Sig  . allopurinol (ZYLOPRIM) 100 MG tablet every morning.  Marland Kitchen aspirin EC 81 MG tablet Take by mouth daily.  Marland Kitchen atorvastatin (LIPITOR) 40 MG tablet TAKE 1 TABLET BY MOUTH  DAILY  . bisoprolol (ZEBETA) 5 MG tablet Take 5 mg by mouth daily.  . clopidogrel (PLAVIX) 75 MG tablet 1 tablet daily.  Marland Kitchen gabapentin (NEURONTIN) 100 MG capsule Take 1 capsule (100 mg total) by mouth 3 (three) times daily.  . insulin glargine (LANTUS SOLOSTAR) 100 UNIT/ML Solostar Pen INJECT SUBCUTANEOUSLY 40  UNITS AT BEDTIME  . liraglutide (VICTOZA) 18 MG/3ML SOPN Inject 0.3 mLs (1.8 mg total) into the skin at bedtime.  Marland Kitchen lisinopril (ZESTRIL) 5 MG tablet Take 1 tablet (5 mg total) by mouth daily.  . meloxicam (MOBIC) 15 MG tablet Take 15 mg by mouth daily.  . metFORMIN (GLUCOPHAGE-XR) 750 MG 24 hr tablet Take 1 tablet (750 mg total) by mouth daily with breakfast.  . nitroGLYCERIN (NITROSTAT) 0.4 MG SL tablet Place 0.4 mg under the tongue every 5 (five) minutes as needed for chest pain.  . [DISCONTINUED] LANTUS SOLOSTAR 100 UNIT/ML Solostar Pen INJECT  SUBCUTANEOUSLY 50  UNITS AT BEDTIME  . [DISCONTINUED] liraglutide (VICTOZA) 18 MG/3ML SOPN Inject 1.8 mg into the skin at bedtime.  . [DISCONTINUED] metFORMIN (GLUCOPHAGE-XR) 750 MG 24 hr tablet Take 750 mg by mouth daily with breakfast.  . [DISCONTINUED] nebivolol (BYSTOLIC) 5 MG tablet Take 5 mg by mouth daily.  . [DISCONTINUED] ticagrelor (BRILINTA) 90 MG TABS tablet Take by mouth 2 (two) times daily.   No facility-administered encounter medications on file as of 04/07/2020.    ALLERGIES: Allergies  Allergen Reactions  . Latex     VACCINATION STATUS:  There is no immunization history on file for this patient.  Diabetes She presents for her follow-up diabetic visit. She has type 2 diabetes mellitus. Onset time: She was diagnosed at approximate age of 33 years. Her disease course has been stable. There are no hypoglycemic  associated symptoms. Pertinent negatives for hypoglycemia include no confusion, headaches, pallor or seizures. Pertinent negatives for diabetes include no chest pain, no polydipsia, no polyphagia and no polyuria. There are no hypoglycemic complications. Symptoms are stable. Diabetic complications include heart disease. (She is status post 3 stent placement for coronary artery disease and December 2015-about the time of her diagnosis with diabetes.) Risk factors for coronary artery disease include dyslipidemia, diabetes mellitus, family history, obesity, hypertension, post-menopausal and sedentary lifestyle. Current diabetic treatment includes insulin injections and oral agent (monotherapy) (She is currently on Basaglar 40 units nightly, Victoza 1.2 mg daily, and metformin 750 mg p.o. daily.). Her weight is decreasing steadily. She has not had a previous visit with a dietitian. She rarely participates in exercise. Her home blood glucose trend is decreasing steadily. Her breakfast blood glucose range is generally 130-140 mg/dl. Her overall blood glucose range is 130-140 mg/dl. (She presents with controlled glycemic profile to target at fasting.  Her point-of-care A1c 6.7%, improving.) An ACE inhibitor/angiotensin II receptor blocker is being taken. Eye exam is current.  Hyperlipidemia This is a chronic problem. The current episode started more than 1 year ago. Exacerbating diseases include diabetes and obesity. Pertinent negatives include no chest pain, myalgias or shortness of breath. Current antihyperlipidemic treatment includes statins. Risk factors for coronary artery disease include hypertension, dyslipidemia, a sedentary lifestyle, post-menopausal, diabetes mellitus and obesity.  Hypertension This is a chronic problem. The current episode started more than 1 year ago. Pertinent negatives include no chest pain, headaches, palpitations or shortness of breath. Risk factors for coronary artery disease include  dyslipidemia, diabetes mellitus, family history, post-menopausal state, obesity and sedentary lifestyle. Past treatments include ACE inhibitors.    Review of systems  Constitutional: + Minimally fluctuating body weight,  current  Body mass index is 42.29 kg/m. , no fatigue, no subjective hyperthermia, no subjective hypothermia Eyes: no blurry vision, no xerophthalmia ENT: no sore throat, no nodules palpated in throat, no dysphagia/odynophagia, no hoarseness Cardiovascular: no Chest Pain, no Shortness of Breath, no palpitations, no leg swelling Respiratory: no cough, no shortness of breath Gastrointestinal: no Nausea/Vomiting/Diarhhea Musculoskeletal: no muscle/joint aches Skin: no rashes, no hyperemia Neurological: no tremors, no numbness, no tingling, no dizziness Psychiatric: no depression, no anxiety   Objective:    BP 109/71   Pulse 67   Ht 5\' 4"  (1.626 m)   Wt 246 lb 6.4 oz (111.8 kg)   BMI 42.29 kg/m   Wt Readings from Last 3 Encounters:  04/07/20 246 lb 6.4 oz (111.8 kg)  12/01/18 243 lb (110.2 kg)  08/27/18 245 lb (111.1 kg)       Physical Exam-  Limited  Constitutional:  Body mass index is 42.29 kg/m. , not in acute distress, normal state of mind Eyes:  EOMI, no exophthalmos Neck: Supple Thyroid: No gross goiter Respiratory: Adequate breathing efforts Musculoskeletal: no gross deformities, strength intact in all four extremities, no gross restriction of joint movements,  + normal foot exam. Skin:  no rashes, no hyperemia Neurological: no tremor with outstretched hands,     Recent Results (from the past 2160 hour(s))  Microalbumin/Creatinine Ratio, Urine     Status: None   Collection Time: 04/06/20 11:32 AM  Result Value Ref Range   Creatinine, Urine 169 20 - 275 mg/dL   Microalb, Ur 0.9 mg/dL    Comment: Reference Range Not established    Microalb Creat Ratio 5 <30 mcg/mg creat    Comment: . The ADA defines abnormalities in albumin excretion as  follows: Marland Kitchen Category         Result (mcg/mg creatinine) . Normal                    <30 Microalbuminuria         30-299  Clinical albuminuria   > OR = 300 . The ADA recommends that at least two of three specimens collected within a 3-6 month period be abnormal before considering a patient to be within a diagnostic category.   Lipid Panel     Status: Abnormal   Collection Time: 04/06/20 11:32 AM  Result Value Ref Range   Cholesterol 155 <200 mg/dL   HDL 32 (L) > OR = 50 mg/dL   Triglycerides 304 (H) <150 mg/dL    Comment: . If a non-fasting specimen was collected, consider repeat triglyceride testing on a fasting specimen if clinically indicated.  Yates Decamp et al. J. of Clin. Lipidol. 0932;3:557-322. Marland Kitchen    LDL Cholesterol (Calc) 86 mg/dL (calc)    Comment: Reference range: <100 . Desirable range <100 mg/dL for primary prevention;   <70 mg/dL for patients with CHD or diabetic patients  with > or = 2 CHD risk factors. Marland Kitchen LDL-C is now calculated using the Martin-Hopkins  calculation, which is a validated novel method providing  better accuracy than the Friedewald equation in the  estimation of LDL-C.  Cresenciano Genre et al. Annamaria Helling. 0254;270(62): 2061-2068  (http://education.[facility].com/faq/FAQ164)    Total CHOL/HDL Ratio 4.8 <5.0 (calc)   Non-HDL Cholesterol (Calc) 123 <130 mg/dL (calc)    Comment: For patients with diabetes plus 1 major ASCVD risk  factor, treating to a non-HDL-C goal of <100 mg/dL  (LDL-C of <70 mg/dL) is considered a therapeutic  option.   COMPLETE METABOLIC PANEL WITH GFR     Status: Abnormal   Collection Time: 04/06/20 11:32 AM  Result Value Ref Range   Glucose, Bld 127 65 - 139 mg/dL    Comment: .        Non-fasting reference interval .    BUN 20 7 - 25 mg/dL   Creat 1.02 (H) 0.50 - 0.99 mg/dL    Comment: For patients >63 years of age, the reference limit for Creatinine is approximately 13% higher for people identified as  African-American. .    GFR, Est Non African American 58 (L) > OR = 60 mL/min/1.80m2   GFR, Est African American 68 > OR = 60 mL/min/1.40m2   BUN/Creatinine Ratio 20 6 - 22 (calc)   Sodium 139 135 - 146 mmol/L   Potassium 4.8 3.5 - 5.3 mmol/L   Chloride 102 98 - 110 mmol/L  CO2 29 20 - 32 mmol/L   Calcium 10.3 8.6 - 10.4 mg/dL   Total Protein 7.1 6.1 - 8.1 g/dL   Albumin 4.2 3.6 - 5.1 g/dL   Globulin 2.9 1.9 - 3.7 g/dL (calc)   AG Ratio 1.4 1.0 - 2.5 (calc)   Total Bilirubin 0.9 0.2 - 1.2 mg/dL   Alkaline phosphatase (APISO) 114 37 - 153 U/L   AST 15 10 - 35 U/L   ALT 18 6 - 29 U/L  HgB A1c     Status: Abnormal   Collection Time: 04/07/20 11:22 AM  Result Value Ref Range   Hemoglobin A1C 6.7 (A) 4.0 - 5.6 %   HbA1c POC (<> result, manual entry)     HbA1c, POC (prediabetic range)     HbA1c, POC (controlled diabetic range)     Lipid Panel     Component Value Date/Time   CHOL 155 04/06/2020 1132   TRIG 304 (H) 04/06/2020 1132   HDL 32 (L) 04/06/2020 1132   CHOLHDL 4.8 04/06/2020 1132   LDLCALC 86 04/06/2020 1132      Assessment & Plan:   1. Type 2 diabetes mellitus with stage 3 chronic kidney disease, with long-term current use of insulin (HCC)   - Alyssa Fox has currently uncontrolled symptomatic type 2 DM since 64 years of age.  She presents with controlled glycemic profile to target at fasting.  Her point-of-care A1c 6.7%, improving. - Recent labs reviewed, stage 3 renal insufficiency.  -her diabetes is complicated by coronary artery disease which required stent placement in 2015, renal insufficiency, obesity/sedentary life and Alyssa Fox remains at a high risk for more acute and chronic complications which include CAD, CVA, CKD, retinopathy, and neuropathy. These are all discussed in detail with the patient.  - I have counseled her on diet management and weight loss, by adopting a carbohydrate restricted/protein rich diet.  - she  admits there is  a room for improvement in her diet and drink choices. -  Suggestion is made for her to avoid simple carbohydrates  from her diet including Cakes, Sweet Desserts / Pastries, Ice Cream, Soda (diet and regular), Sweet Tea, Candies, Chips, Cookies, Sweet Pastries,  Store Bought Juices, Alcohol in Excess of  1-2 drinks a day, Artificial Sweeteners, Coffee Creamer, and "Sugar-free" Products. This will help patient to have stable blood glucose profile and potentially avoid unintended weight gain.   - I encouraged her to switch to  unprocessed or minimally processed complex starch and increased protein intake (animal or plant source), fruits, and vegetables.  - she is advised to stick to a routine mealtimes to eat 3 meals  a day and avoid unnecessary snacks ( to snack only to correct hypoglycemia).   - I have approached her with the following individualized plan to manage diabetes and patient agrees:   -Based on her presentation with controlled glycemic profile to target, she will not need prandial insulin for now. -She will continue to benefit from basal insulin.  She is advised to lower her Lantus to 40  units nightly,  associated with strict monitoring of blood glucose 2 times a day. -She is advised to continue Victoza 1.8 mg subcutaneously daily,  continue metformin 750 mg ER daily after breakfast.   - Patient is warned not to take insulin without proper monitoring per orders.  -Patient is encouraged to call clinic for blood glucose levels less than 70 or above 200 mg /dl.  2) BP/HTN: Her blood pressure  is controlled to target.   She is advised to continue her current blood pressure medications including lisinopril 5 mg p.o. daily.    3) Lipids/HPL: Her last lipid panel showed LDL of 78.  She will continue to benefit from statin therapy.  She is advised to continue Lipitor 40 mg p.o. daily at bedtime.  Side effects and precautions discussed with her.     4) weight management-her BMI is 42.2.  She  is a candidate for modest weight loss.  She would benefit from bariatric surgery.  She was given information in the past, did not initiate any process.  Carbs and exercise regimen discussed in detail with her.    5) Chronic Care/Health Maintenance:  -she  is on ACEI/ARB and Statin medications and  is encouraged to continue to follow up with Ophthalmology, Dentist,  Podiatrist at least yearly or according to recommendations, and advised to  stay away from smoking. I have recommended yearly flu vaccine and pneumonia vaccination at least every 5 years; moderate intensity exercise for up to 150 minutes weekly; and  sleep for at least 7 hours a day. -Foot exam is significant for peripheral neuropathy, will refill her gabapentin.  - I advised patient to maintain close follow up with Jonetta Speak, MD for primary care needs.  - Time spent on this patient care encounter:  35 min, of which > 50% was spent in  counseling and the rest reviewing her blood glucose logs , discussing her hypoglycemia and hyperglycemia episodes, reviewing her current and  previous labs / studies  ( including abstraction from other facilities) and medications  doses and developing a  long term treatment plan and documenting her care.   Please refer to Patient Instructions for Blood Glucose Monitoring and Insulin/Medications Dosing Guide"  in media tab for additional information. Please  also refer to " Patient Self Inventory" in the Media  tab for reviewed elements of pertinent patient history.  Wilson Singer participated in the discussions, expressed understanding, and voiced agreement with the above plans.  All questions were answered to her satisfaction. she is encouraged to contact clinic should she have any questions or concerns prior to her return visit.  Follow up plan: - Return in about 4 months (around 08/08/2020) for Bring Meter and Logs- A1c in Office.  Marquis Lunch, MD [facility] [address] [place], [place] [address] Phone: [phone]  Fax: [phone]    04/07/2020, 12:34 PM  This note was partially dictated with voice recognition software. Similar sounding words can be transcribed inadequately or may not  be corrected upon review.

## 2020-04-07 NOTE — Patient Instructions (Signed)

## 2020-04-17 ENCOUNTER — Other Ambulatory Visit: Payer: Self-pay | Admitting: "Endocrinology

## 2020-06-16 ENCOUNTER — Other Ambulatory Visit: Payer: Self-pay

## 2020-06-16 DIAGNOSIS — Z794 Long term (current) use of insulin: Secondary | ICD-10-CM

## 2020-06-16 MED ORDER — PEN NEEDLES 31G X 8 MM MISC: 3 refills | Status: DC

## 2020-06-20 ENCOUNTER — Other Ambulatory Visit: Payer: Self-pay | Admitting: "Endocrinology

## 2020-08-11 ENCOUNTER — Other Ambulatory Visit: Payer: Self-pay

## 2020-08-11 ENCOUNTER — Ambulatory Visit (INDEPENDENT_AMBULATORY_CARE_PROVIDER_SITE_OTHER): Payer: 59 | Admitting: Nurse Practitioner

## 2020-08-11 ENCOUNTER — Encounter: Payer: Self-pay | Admitting: Nurse Practitioner

## 2020-08-11 VITALS — BP 115/76 | HR 73 | Ht 64.0 in | Wt 248.2 lb

## 2020-08-11 DIAGNOSIS — Z794 Long term (current) use of insulin: Secondary | ICD-10-CM | POA: Diagnosis not present

## 2020-08-11 DIAGNOSIS — N1831 Chronic kidney disease, stage 3a: Secondary | ICD-10-CM

## 2020-08-11 DIAGNOSIS — E782 Mixed hyperlipidemia: Secondary | ICD-10-CM

## 2020-08-11 DIAGNOSIS — E1121 Type 2 diabetes mellitus with diabetic nephropathy: Secondary | ICD-10-CM | POA: Diagnosis not present

## 2020-08-11 DIAGNOSIS — E1122 Type 2 diabetes mellitus with diabetic chronic kidney disease: Secondary | ICD-10-CM

## 2020-08-11 DIAGNOSIS — I10 Essential (primary) hypertension: Secondary | ICD-10-CM | POA: Diagnosis not present

## 2020-08-11 LAB — POCT GLYCOSYLATED HEMOGLOBIN (HGB A1C): Hemoglobin A1C: 6.9 % — AB (ref 4.0–5.6)

## 2020-08-11 MED ORDER — LANTUS SOLOSTAR 100 UNIT/ML ~~LOC~~ SOPN: PEN_INJECTOR | SUBCUTANEOUS | 3 refills | Status: DC

## 2020-08-11 NOTE — Patient Instructions (Signed)

## 2020-08-11 NOTE — Progress Notes (Addendum)
08/11/2020, 9:13 AM                 Endocrinology follow-up note   Subjective:    [name], female    DOB: 12-Sep-1956.  Alyssa Fox is being seen in follow-up  for management of currently uncontrolled symptomatic 2 diabetes, hyperlipidemia, hypertension, obesity. PMD:   Jonetta Speak, MD.   Past Medical History:  Diagnosis Date  . DM type 2 causing vascular disease (HCC)   . Hypertension   . MI (myocardial infarction) [facility])    Past Surgical History:  Procedure Laterality Date  . CHOLECYSTECTOMY    . CORONARY ANGIOPLASTY WITH STENT PLACEMENT    . TUBAL LIGATION     Social History   Socioeconomic History  . Marital status: Married    Spouse name: Not on file  . Number of children: Not on file  . Years of education: Not on file  . Highest education level: Not on file  Occupational History  . Not on file  Tobacco Use  . Smoking status: Never Smoker  . Smokeless tobacco: Never Used  Vaping Use  . Vaping Use: Never used  Substance and Sexual Activity  . Alcohol use: No  . Drug use: No  . Sexual activity: Not on file  Other Topics Concern  . Not on file  Social History Narrative  . Not on file   Social Determinants of Health   Financial Resource Strain:   . Difficulty of Paying Living Expenses: Not on file  Food Insecurity:   . Worried About Programme researcher, broadcasting/film/video in the Last Year: Not on file  . Ran Out of Food in the Last Year: Not on file  Transportation Needs:   . Lack of Transportation (Medical): Not on file  . Lack of Transportation (Non-Medical): Not on file  Physical Activity:   . Days of Exercise per Week: Not on file  . Minutes of Exercise per Session: Not on file  Stress:   . Feeling of Stress : Not on file  Social Connections:   . Frequency of Communication with Friends and Family: Not on file  . Frequency of Social Gatherings with Friends and Family: Not on file  . Attends Religious  Services: Not on file  . Active Member of Clubs or Organizations: Not on file  . Attends Banker Meetings: Not on file  . Marital Status: Not on file   Outpatient Encounter Medications as of 08/11/2020  Medication Sig  . allopurinol (ZYLOPRIM) 100 MG tablet every morning.  Marland Kitchen aspirin EC 81 MG tablet Take by mouth daily.  Marland Kitchen atorvastatin (LIPITOR) 40 MG tablet TAKE 1 TABLET BY MOUTH  DAILY  . azelastine (OPTIVAR) 0.05 % ophthalmic solution Apply 1 drop to eye 2 (two) times daily.  . bisoprolol (ZEBETA) 5 MG tablet Take 5 mg by mouth daily.  . clopidogrel (PLAVIX) 75 MG tablet 1 tablet daily.  Marland Kitchen gabapentin (NEURONTIN) 100 MG capsule Take 1 capsule (100 mg total) by mouth 3 (three) times daily.  . insulin glargine (LANTUS SOLOSTAR) 100 UNIT/ML Solostar Pen INJECT SUBCUTANEOUSLY 45 UNITS AT BEDTIME  . Insulin Pen Needle (PEN NEEDLES) 31G X 8 MM MISC Use as directed twice daily  . liraglutide (VICTOZA) 18 MG/3ML SOPN Inject 0.3 mLs (1.8 mg total) into the skin at bedtime.  Marland Kitchen lisinopril (ZESTRIL) 5 MG tablet Take 1 tablet (5 mg  total) by mouth daily.  . meloxicam (MOBIC) 15 MG tablet Take 15 mg by mouth daily.  . metFORMIN (GLUCOPHAGE-XR) 750 MG 24 hr tablet Take 1 tablet (750 mg total) by mouth daily with breakfast.  . nitroGLYCERIN (NITROSTAT) 0.4 MG SL tablet Place 0.4 mg under the tongue every 5 (five) minutes as needed for chest pain.  Letta Pate ULTRA test strip 1 each 3 (three) times daily.  . SV MELATONIN 5 MG TABS Take 5 mg by mouth daily.  . [DISCONTINUED] insulin glargine (LANTUS SOLOSTAR) 100 UNIT/ML Solostar Pen INJECT SUBCUTANEOUSLY 40  UNITS AT BEDTIME   No facility-administered encounter medications on file as of 08/11/2020.    ALLERGIES: Allergies  Allergen Reactions  . Latex     VACCINATION STATUS:  There is no immunization history on file for this patient.  Diabetes She presents for her follow-up diabetic visit. She has type 2 diabetes mellitus. Onset time:  She was diagnosed at approximate age of 37 years. Her disease course has been stable. There are no hypoglycemic associated symptoms. Pertinent negatives for hypoglycemia include no confusion, headaches, pallor or seizures. Pertinent negatives for diabetes include no chest pain, no polydipsia, no polyphagia and no polyuria. There are no hypoglycemic complications. Symptoms are stable. Diabetic complications include heart disease and peripheral neuropathy. (She is status post 3 stent placement for coronary artery disease and December 2015-about the time of her diagnosis with diabetes.) Risk factors for coronary artery disease include dyslipidemia, diabetes mellitus, family history, obesity, hypertension, post-menopausal and sedentary lifestyle. Current diabetic treatment includes insulin injections and oral agent (monotherapy) (She is currently on Basaglar 40 units nightly, Victoza 1.2 mg daily, and metformin 750 mg p.o. daily.). She is compliant with treatment most of the time. Her weight is fluctuating minimally. She is following a generally unhealthy diet. When asked about meal planning, she reported none. She has not had a previous visit with a dietitian. She rarely participates in exercise. Her home blood glucose trend is fluctuating minimally. Her breakfast blood glucose range is generally 140-180 mg/dl. (She presents today with her meter and logs showing slightly above target fasting glycemic profile.  Her POCT A1C today is 6.9%, essentially unchanged from last visit at 6.7%.  There are no major episodes of hypoglycemia noted.) An ACE inhibitor/angiotensin II receptor blocker is being taken. She does not see a podiatrist.Eye exam is current.  Hyperlipidemia This is a chronic problem. The current episode started more than 1 year ago. The problem is controlled. Recent lipid tests were reviewed and are normal. Exacerbating diseases include diabetes and obesity. There are no known factors aggravating her  hyperlipidemia. Pertinent negatives include no chest pain, myalgias or shortness of breath. Current antihyperlipidemic treatment includes statins. The current treatment provides moderate improvement of lipids. Compliance problems include adherence to diet and adherence to exercise.  Risk factors for coronary artery disease include hypertension, dyslipidemia, a sedentary lifestyle, post-menopausal, diabetes mellitus and obesity.  Hypertension This is a chronic problem. The current episode started more than 1 year ago. The problem has been gradually improving since onset. The problem is controlled. Pertinent negatives include no chest pain, headaches, palpitations or shortness of breath. Risk factors for coronary artery disease include dyslipidemia, diabetes mellitus, family history, post-menopausal state, obesity and sedentary lifestyle. Past treatments include ACE inhibitors. The current treatment provides moderate improvement. There are no compliance problems.     Review of systems  Constitutional: + Minimally fluctuating body weight,  current  Body mass index is 42.6 kg/m. ,  no fatigue, no subjective hyperthermia, no subjective hypothermia Eyes: no blurry vision, no xerophthalmia ENT: no sore throat, no nodules palpated in throat, no dysphagia/odynophagia, no hoarseness Cardiovascular: no Chest Pain, no Shortness of Breath, no palpitations, no leg swelling Respiratory: no cough, no shortness of breath Gastrointestinal: no Nausea/Vomiting/Diarrhea Musculoskeletal: no muscle/joint aches Skin: no rashes, no hyperemia Neurological: no tremors, no numbness, no tingling, no dizziness Psychiatric: no depression, no anxiety   Objective:    BP 115/76 (BP Location: Right Arm, Patient Position: Sitting)   Pulse 73   Ht 5\' 4"  (1.626 m)   Wt 248 lb 3.2 oz (112.6 kg)   BMI 42.60 kg/m   Wt Readings from Last 3 Encounters:  08/11/20 248 lb 3.2 oz (112.6 kg)  04/07/20 246 lb 6.4 oz (111.8 kg)   12/01/18 243 lb (110.2 kg)      BP Readings from Last 3 Encounters:  08/11/20 115/76  04/07/20 109/71  12/01/18 121/76     Physical Exam- Limited  Constitutional:  Body mass index is 42.6 kg/m. , not in acute distress, normal state of mind Eyes:  EOMI, no exophthalmos Neck: Supple Thyroid: No gross goiter Cardiovascular: RRR, no murmers, rubs, or gallops, no edema Respiratory: Adequate breathing efforts, no crackles, rales, rhonchi, or wheezing Musculoskeletal: no gross deformities, strength intact in all four extremities, no gross restriction of joint movements Skin:  no rashes, no hyperemia Neurological: no tremor with outstretched hands    Recent Results (from the past 2160 hour(s))  HgB A1c     Status: Abnormal   Collection Time: 08/11/20  8:56 AM  Result Value Ref Range   Hemoglobin A1C 6.9 (A) 4.0 - 5.6 %   HbA1c POC (<> result, manual entry)     HbA1c, POC (prediabetic range)     HbA1c, POC (controlled diabetic range)     Lipid Panel     Component Value Date/Time   CHOL 155 04/06/2020 1132   TRIG 304 (H) 04/06/2020 1132   HDL 32 (L) 04/06/2020 1132   CHOLHDL 4.8 04/06/2020 1132   LDLCALC 86 04/06/2020 1132      Assessment & Plan:   1. Type 2 diabetes mellitus with stage 3 chronic kidney disease, with long-term current use of insulin (HCC)   - Alyssa Fox has currently uncontrolled symptomatic type 2 DM since 64 years of age.  She presents today with her meter and logs showing slightly above target fasting glycemic profile.  Her POCT A1C today is 6.9%, essentially unchanged from last visit at 6.7%.  There are no major episodes of hypoglycemia noted.  - Recent labs reviewed, stage 3 renal insufficiency.  -her diabetes is complicated by coronary artery disease which required stent placement in 2015, renal insufficiency, obesity/sedentary life and Alyssa Fox remains at a high risk for more acute and chronic complications which include CAD,  CVA, CKD, retinopathy, and neuropathy. These are all discussed in detail with the patient.  - I have counseled her on diet management and weight loss, by adopting a carbohydrate restricted/protein rich diet.  - The patient admits there is a room for improvement in their diet and drink choices. -  Suggestion is made for the patient to avoid simple carbohydrates from their diet including Cakes, Sweet Desserts / Pastries, Ice Cream, Soda (diet and regular), Sweet Tea, Candies, Chips, Cookies, Sweet Pastries,  Store Bought Juices, Alcohol in Excess of  1-2 drinks a day, Artificial Sweeteners, Coffee Creamer, and "Sugar-free" Products. This will help patient to  have stable blood glucose profile and potentially avoid unintended weight gain.   - I encouraged the patient to switch to  unprocessed or minimally processed complex starch and increased protein intake (animal or plant source), fruits, and vegetables.   - Patient is advised to stick to a routine mealtimes to eat 3 meals  a day and avoid unnecessary snacks ( to snack only to correct hypoglycemia).  - I have approached her with the following individualized plan to manage diabetes and patient agrees:   -Based on her presentation with near target fasting glycemic profile, she will not need prandial insulin for now. -She is advised to increase her Lantus to 45 units SQ nightly.  She is advised to continue Victoza 1.8 mg subcutaneously daily,  continue metformin 750 mg ER daily with breakfast.    -She is encouraged to monitor blood glucose at least twice daily, before breakfast and before bed and report readings less than 70 or greater than 200 for 3 tests in a row.  - Patient is warned not to take insulin without proper monitoring per orders.  2) BP/HTN:  Her blood pressure is controlled to target.  She is advised to continue Lisinopril 5 mg po daily.  3) Lipids/HPL:  Her recent lipid panel on 04/06/20 shows controlled LDL of 86.  She is advised  to continue Atorvastatin 40 mg po daily at bedtime.  Side effects and precautions discussed with her.  4) Weight management- her Body mass index is 42.6 kg/m.Marland Kitchen  She is a candidate for modest weight loss.  She would benefit from bariatric surgery.  She was given information in the past, did not initiate any process.  Carbs and exercise regimen discussed in detail with her.    5) Chronic Care/Health Maintenance: -she  is on ACEI/ARB and Statin medications and is encouraged to continue to follow up with Ophthalmology, Dentist, Podiatrist at least yearly or according to recommendations, and advised to  stay away from smoking. I have recommended yearly flu vaccine and pneumonia vaccination at least every 5 years; moderate intensity exercise for up to 150 minutes weekly; and  sleep for at least 7 hours a day.  - I advised patient to maintain close follow up with Jonetta Speak, MD for primary care needs.  - Time spent on this patient care encounter:  35 min, of which > 50% was spent in  counseling and the rest reviewing her blood glucose logs , discussing her hypoglycemia and hyperglycemia episodes, reviewing her current and  previous labs / studies  ( including abstraction from other facilities) and medications  doses and developing a  long term treatment plan and documenting her care.   Please refer to Patient Instructions for Blood Glucose Monitoring and Insulin/Medications Dosing Guide"  in media tab for additional information. Please  also refer to " Patient Self Inventory" in the Media  tab for reviewed elements of pertinent patient history.  Alyssa Fox participated in the discussions, expressed understanding, and voiced agreement with the above plans.  All questions were answered to her satisfaction. she is encouraged to contact clinic should she have any questions or concerns prior to her return visit.  Follow up plan: - Return in about 4 months (around 12/11/2020) for Diabetes follow up,  Previsit labs, Virtual visit ok.  Ronny Bacon, FNP-BC [facility] Phone: [phone] Fax: [phone]  08/11/2020, 9:13 AM  This note was partially dictated with voice recognition software. Similar sounding words can be transcribed inadequately or may not  be corrected upon review.

## 2020-08-12 ENCOUNTER — Other Ambulatory Visit: Payer: Self-pay | Admitting: "Endocrinology

## 2020-10-06 ENCOUNTER — Other Ambulatory Visit: Payer: Self-pay

## 2020-10-06 DIAGNOSIS — E1122 Type 2 diabetes mellitus with diabetic chronic kidney disease: Secondary | ICD-10-CM

## 2020-10-06 DIAGNOSIS — Z794 Long term (current) use of insulin: Secondary | ICD-10-CM

## 2020-10-06 MED ORDER — GABAPENTIN 100 MG PO CAPS: 100.0000 mg | ORAL_CAPSULE | Freq: Three times a day (TID) | ORAL | 3 refills | Status: DC

## 2020-12-14 ENCOUNTER — Ambulatory Visit: Payer: 59 | Admitting: Nurse Practitioner

## 2021-01-11 ENCOUNTER — Ambulatory Visit: Payer: 59 | Admitting: Nurse Practitioner

## 2021-01-12 LAB — COMPREHENSIVE METABOLIC PANEL
ALT: 20 IU/L (ref 0–32)
AST: 20 IU/L (ref 0–40)
Albumin/Globulin Ratio: 1.5 (ref 1.2–2.2)
Albumin: 4.4 g/dL (ref 3.8–4.8)
Alkaline Phosphatase: 150 IU/L — ABNORMAL HIGH (ref 44–121)
BUN/Creatinine Ratio: 16 (ref 12–28)
BUN: 20 mg/dL (ref 8–27)
Bilirubin Total: 0.8 mg/dL (ref 0.0–1.2)
CO2: 24 mmol/L (ref 20–29)
Calcium: 9.9 mg/dL (ref 8.7–10.3)
Chloride: 101 mmol/L (ref 96–106)
Creatinine, Ser: 1.22 mg/dL — ABNORMAL HIGH (ref 0.57–1.00)
GFR calc Af Amer: 54 mL/min/{1.73_m2} — ABNORMAL LOW (ref >59)
GFR calc non Af Amer: 47 mL/min/{1.73_m2} — ABNORMAL LOW (ref >59)
Globulin, Total: 2.9 g/dL (ref 1.5–4.5)
Glucose: 131 mg/dL — ABNORMAL HIGH (ref 65–99)
Potassium: 4.5 mmol/L (ref 3.5–5.2)
Sodium: 140 mmol/L (ref 134–144)
Total Protein: 7.3 g/dL (ref 6.0–8.5)

## 2021-01-12 LAB — HEMOGLOBIN A1C
Est. average glucose Bld gHb Est-mCnc: 146 mg/dL
Hgb A1c MFr Bld: 6.7 % — ABNORMAL HIGH (ref 4.8–5.6)

## 2021-01-12 LAB — T4, FREE: Free T4: 1.19 ng/dL (ref 0.82–1.77)

## 2021-01-12 LAB — VITAMIN D 25 HYDROXY (VIT D DEFICIENCY, FRACTURES): Vit D, 25-Hydroxy: 27.6 ng/mL — ABNORMAL LOW (ref 30.0–100.0)

## 2021-01-12 LAB — TSH: TSH: 2.05 u[IU]/mL (ref 0.450–4.500)

## 2021-01-16 ENCOUNTER — Ambulatory Visit (INDEPENDENT_AMBULATORY_CARE_PROVIDER_SITE_OTHER): Payer: 59 | Admitting: Nurse Practitioner

## 2021-01-16 ENCOUNTER — Other Ambulatory Visit: Payer: Self-pay

## 2021-01-16 ENCOUNTER — Encounter: Payer: Self-pay | Admitting: Nurse Practitioner

## 2021-01-16 VITALS — BP 106/70 | HR 73 | Ht 64.0 in | Wt 244.2 lb

## 2021-01-16 DIAGNOSIS — E782 Mixed hyperlipidemia: Secondary | ICD-10-CM | POA: Diagnosis not present

## 2021-01-16 DIAGNOSIS — Z794 Long term (current) use of insulin: Secondary | ICD-10-CM

## 2021-01-16 DIAGNOSIS — I10 Essential (primary) hypertension: Secondary | ICD-10-CM

## 2021-01-16 DIAGNOSIS — E559 Vitamin D deficiency, unspecified: Secondary | ICD-10-CM

## 2021-01-16 DIAGNOSIS — N1831 Chronic kidney disease, stage 3a: Secondary | ICD-10-CM

## 2021-01-16 DIAGNOSIS — E1122 Type 2 diabetes mellitus with diabetic chronic kidney disease: Secondary | ICD-10-CM

## 2021-01-16 NOTE — Patient Instructions (Signed)

## 2021-01-16 NOTE — Progress Notes (Signed)
01/16/2021, 3:03 PM                 Endocrinology follow-up note   Subjective:    [name], female    DOB: 1956/05/26.  Alyssa Fox is being seen in follow-up  for management of currently uncontrolled symptomatic 2 diabetes, hyperlipidemia, hypertension, obesity. PMD:   Theressa Stamps, MD.   Past Medical History:  Diagnosis Date  . DM type 2 causing vascular disease ([place])   . Hypertension   . MI (myocardial infarction) [facility])    Past Surgical History:  Procedure Laterality Date  . CHOLECYSTECTOMY    . CORONARY ANGIOPLASTY WITH STENT PLACEMENT    . TUBAL LIGATION     Social History   Socioeconomic History  . Marital status: Married    Spouse name: Not on file  . Number of children: Not on file  . Years of education: Not on file  . Highest education level: Not on file  Occupational History  . Not on file  Tobacco Use  . Smoking status: Never Smoker  . Smokeless tobacco: Never Used  Vaping Use  . Vaping Use: Never used  Substance and Sexual Activity  . Alcohol use: No  . Drug use: No  . Sexual activity: Not on file  Other Topics Concern  . Not on file  Social History Narrative  . Not on file   Social Determinants of Health   Financial Resource Strain: Not on file  Food Insecurity: Not on file  Transportation Needs: Not on file  Physical Activity: Not on file  Stress: Not on file  Social Connections: Not on file   Outpatient Encounter Medications as of 01/16/2021  Medication Sig  . allopurinol (ZYLOPRIM) 100 MG tablet every morning.  Marland Kitchen aspirin EC 81 MG tablet Take by mouth daily.  Marland Kitchen atorvastatin (LIPITOR) 40 MG tablet TAKE 1 TABLET BY MOUTH  DAILY  . azelastine (OPTIVAR) 0.05 % ophthalmic solution Apply 1 drop to eye 2 (two) times daily.  . bisoprolol (ZEBETA) 5 MG tablet Take 5 mg by mouth daily.  . clopidogrel (PLAVIX) 75 MG tablet 1 tablet daily.  Marland Kitchen gabapentin (NEURONTIN) 100 MG capsule Take  1 capsule (100 mg total) by mouth 3 (three) times daily.  . insulin glargine (LANTUS SOLOSTAR) 100 UNIT/ML Solostar Pen INJECT SUBCUTANEOUSLY 45 UNITS AT BEDTIME  . Insulin Pen Needle (PEN NEEDLES) 31G X 8 MM MISC Use as directed twice daily  . liraglutide (VICTOZA) 18 MG/3ML SOPN Inject 0.3 mLs (1.8 mg total) into the skin at bedtime.  Marland Kitchen lisinopril (ZESTRIL) 5 MG tablet TAKE 1 TABLET BY MOUTH  DAILY  . meloxicam (MOBIC) 15 MG tablet Take 15 mg by mouth daily.  . metFORMIN (GLUCOPHAGE-XR) 750 MG 24 hr tablet TAKE 1 TABLET BY MOUTH  DAILY WITH BREAKFAST  . nitroGLYCERIN (NITROSTAT) 0.4 MG SL tablet Place 0.4 mg under the tongue every 5 (five) minutes as needed for chest pain.  Glory Rosebush ULTRA test strip 1 each 3 (three) times daily.  . SV MELATONIN 5 MG TABS Take 5 mg by mouth daily.   No facility-administered encounter medications on file as of 01/16/2021.    ALLERGIES: Allergies  Allergen Reactions  . Latex     VACCINATION STATUS:  There is no immunization history on file for this patient.  Diabetes She presents for her follow-up diabetic visit. She has type 2 diabetes  mellitus. Onset time: She was diagnosed at approximate age of 63 years. Her disease course has been improving. There are no hypoglycemic associated symptoms. Pertinent negatives for hypoglycemia include no confusion, headaches, pallor or seizures. Pertinent negatives for diabetes include no chest pain, no polydipsia, no polyphagia and no polyuria. There are no hypoglycemic complications. Symptoms are stable. Diabetic complications include heart disease and peripheral neuropathy. (She is status post 3 stent placement for coronary artery disease and December 2015-about the time of her diagnosis with diabetes.) Risk factors for coronary artery disease include dyslipidemia, diabetes mellitus, family history, obesity, hypertension, post-menopausal and sedentary lifestyle. Current diabetic treatment includes insulin injections and  oral agent (monotherapy) (She is currently on Basaglar 40 units nightly, Victoza 1.2 mg daily, and metformin 750 mg p.o. daily.). She is compliant with treatment most of the time. Her weight is decreasing steadily. She is following a generally unhealthy diet. When asked about meal planning, she reported none. She has not had a previous visit with a dietitian. She rarely participates in exercise. Her home blood glucose trend is decreasing steadily. Her breakfast blood glucose range is generally 90-110 mg/dl. Her bedtime blood glucose range is generally 140-180 mg/dl. (She presents today with her meter and logs showing at goal fasting and postprandial glycemic profile.  Her POCT A1c today is 6.7%, improving from last visit of 6.9%.  She reports the last few months have been stressful as her husband recently had open heart surgery which threw her off her pattern some.  She denies any significant hypoglycemia.) An ACE inhibitor/angiotensin II receptor blocker is being taken. She does not see a podiatrist.Eye exam is current.  Hyperlipidemia This is a chronic problem. The current episode started more than 1 year ago. The problem is controlled. Recent lipid tests were reviewed and are variable. Exacerbating diseases include chronic renal disease, diabetes and obesity. Factors aggravating her hyperlipidemia include beta blockers and fatty foods. Pertinent negatives include no chest pain, myalgias or shortness of breath. Current antihyperlipidemic treatment includes statins. The current treatment provides moderate improvement of lipids. Compliance problems include adherence to diet and adherence to exercise.  Risk factors for coronary artery disease include hypertension, dyslipidemia, a sedentary lifestyle, post-menopausal, diabetes mellitus and obesity.  Hypertension This is a chronic problem. The current episode started more than 1 year ago. The problem has been gradually improving since onset. The problem is  controlled. Pertinent negatives include no chest pain, headaches, palpitations or shortness of breath. There are no associated agents to hypertension. Risk factors for coronary artery disease include dyslipidemia, diabetes mellitus, family history, post-menopausal state, obesity and sedentary lifestyle. Past treatments include ACE inhibitors and beta blockers. The current treatment provides moderate improvement. There are no compliance problems.  Hypertensive end-organ damage includes kidney disease. Identifiable causes of hypertension include chronic renal disease.    Review of systems  Constitutional: + Minimally fluctuating body weight,  current Body mass index is 41.92 kg/m. , no fatigue, no subjective hyperthermia, no subjective hypothermia Eyes: no blurry vision, no xerophthalmia ENT: no sore throat, no nodules palpated in throat, no dysphagia/odynophagia, no hoarseness Cardiovascular: no chest pain, no shortness of breath, no palpitations, no leg swelling Respiratory: no cough, no shortness of breath Gastrointestinal: no nausea/vomiting/diarrhea Musculoskeletal: no muscle/joint aches Skin: no rashes, no hyperemia Neurological: no tremors, no numbness, no tingling, no dizziness Psychiatric: no depression, no anxiety   Objective:    BP 106/70 (BP Location: Left Arm)   Pulse 73   Ht _0  (1.626 m)  Wt 244 lb 3.2 oz (110.8 kg)   BMI 41.92 kg/m   Wt Readings from Last 3 Encounters:  01/16/21 244 lb 3.2 oz (110.8 kg)  08/11/20 248 lb 3.2 oz (112.6 kg)  04/07/20 246 lb 6.4 oz (111.8 kg)      BP Readings from Last 3 Encounters:  01/16/21 106/70  08/11/20 115/76  04/07/20 109/71      Physical Exam- Limited  Constitutional:  Body mass index is 41.92 kg/m. , not in acute distress, normal state of mind Eyes:  EOMI, no exophthalmos Neck: Supple Cardiovascular: RRR, no murmers, rubs, or gallops, no edema Respiratory: Adequate breathing efforts, no crackles, rales, rhonchi,  or wheezing Musculoskeletal: no gross deformities, strength intact in all four extremities, no gross restriction of joint movements Skin:  no rashes, no hyperemia Neurological: no tremor with outstretched hands  POCT ABI Results 01/16/21   Right ABI:  1.40      Left ABI:  1.43  Right leg systolic / diastolic: 209/47 mmHg Left leg systolic / diastolic: 096/28 mmHg  Arm systolic / diastolic: 366/29 mmHG  Detailed report will be scanned into patient chart.  Recent Results (from the past 2160 hour(s))  Comprehensive metabolic panel     Status: Abnormal   Collection Time: 01/11/21  1:16 PM  Result Value Ref Range   Glucose 131 (H) 65 - 99 mg/dL   BUN 20 8 - 27 mg/dL   Creatinine, Ser 1.22 (H) 0.57 - 1.00 mg/dL   GFR calc non Af Amer 47 (L) >59 mL/min/1.73   GFR calc Af Amer 54 (L) >59 mL/min/1.73    Comment: **In accordance with recommendations from the NKF-ASN Task force,**   [facility] is in the process of updating its eGFR calculation to the   2021 CKD-EPI creatinine equation that estimates kidney function   without a race variable.    BUN/Creatinine Ratio 16 12 - 28   Sodium 140 134 - 144 mmol/L   Potassium 4.5 3.5 - 5.2 mmol/L   Chloride 101 96 - 106 mmol/L   CO2 24 20 - 29 mmol/L   Calcium 9.9 8.7 - 10.3 mg/dL   Total Protein 7.3 6.0 - 8.5 g/dL   Albumin 4.4 3.8 - 4.8 g/dL   Globulin, Total 2.9 1.5 - 4.5 g/dL   Albumin/Globulin Ratio 1.5 1.2 - 2.2   Bilirubin Total 0.8 0.0 - 1.2 mg/dL   Alkaline Phosphatase 150 (H) 44 - 121 IU/L   AST 20 0 - 40 IU/L   ALT 20 0 - 32 IU/L  TSH     Status: None   Collection Time: 01/11/21  1:16 PM  Result Value Ref Range   TSH 2.050 0.450 - 4.500 uIU/mL  T4, free     Status: None   Collection Time: 01/11/21  1:16 PM  Result Value Ref Range   Free T4 1.19 0.82 - 1.77 ng/dL  VITAMIN D 25 Hydroxy (Vit-D Deficiency, Fractures)     Status: Abnormal   Collection Time: 01/11/21  1:16 PM  Result Value Ref Range   Vit D, 25-Hydroxy 27.6 (L)  30.0 - 100.0 ng/mL    Comment: Vitamin D deficiency has been defined by the Institute of Medicine and an Endocrine Society practice guideline as a level of serum 25-OH vitamin D less than 20 ng/mL (1,2). The Endocrine Society went on to further define vitamin D insufficiency as a level between 21 and 29 ng/mL (2). 1. IOM (Institute of Medicine). 2010. Dietary reference  intakes for calcium and D. [place]: The    [employer]. 2. Holick MF, Binkley [place], Bischoff-Ferrari HA, et al.    Evaluation, treatment, and prevention of vitamin D    deficiency: an Endocrine Society clinical practice    guideline. JCEM. 2011 Jul; 96(7):1911-30.   Hemoglobin A1c     Status: Abnormal   Collection Time: 01/11/21  1:16 PM  Result Value Ref Range   Hgb A1c MFr Bld 6.7 (H) 4.8 - 5.6 %    Comment:          Prediabetes: 5.7 - 6.4          Diabetes: >6.4          Glycemic control for adults with diabetes: <7.0    Est. average glucose Bld gHb Est-mCnc 146 mg/dL   Lipid Panel     Component Value Date/Time   CHOL 155 04/06/2020 1132   TRIG 304 (H) 04/06/2020 1132   HDL 32 (L) 04/06/2020 1132   CHOLHDL 4.8 04/06/2020 1132   LDLCALC 86 04/06/2020 1132      Assessment & Plan:   1) Type 2 diabetes mellitus with stage 3 chronic kidney disease, with long-term current use of insulin (HCC)  - Alyssa Fox has currently uncontrolled symptomatic type 2 DM since 65 years of age.  She presents today with her meter and logs showing at goal fasting and postprandial glycemic profile.  Her POCT A1c today is 6.7%, improving from last visit of 6.9%.  She reports the last few months have been stressful as her husband recently had open heart surgery which threw her off her pattern some.  She denies any significant hypoglycemia.  - Recent labs reviewed, stable stage 3 renal insufficiency.  -her diabetes is complicated by coronary artery disease which required stent placement in 2015, renal  insufficiency, obesity/sedentary life and Alyssa Fox remains at a high risk for more acute and chronic complications which include CAD, CVA, CKD, retinopathy, and neuropathy. These are all discussed in detail with the patient.  - Nutritional counseling repeated at each appointment due to patients tendency to fall back in to old habits.  - The patient admits there is a room for improvement in their diet and drink choices. -  Suggestion is made for the patient to avoid simple carbohydrates from their diet including Cakes, Sweet Desserts / Pastries, Ice Cream, Soda (diet and regular), Sweet Tea, Candies, Chips, Cookies, Sweet Pastries,  Store Bought Juices, Alcohol in Excess of  1-2 drinks a day, Artificial Sweeteners, Coffee Creamer, and "Sugar-free" Products. This will help patient to have stable blood glucose profile and potentially avoid unintended weight gain.   - I encouraged the patient to switch to  unprocessed or minimally processed complex starch and increased protein intake (animal or plant source), fruits, and vegetables.   - Patient is advised to stick to a routine mealtimes to eat 3 meals  a day and avoid unnecessary snacks ( to snack only to correct hypoglycemia).  - I have approached her with the following individualized plan to manage diabetes and patient agrees:   -Based on her presentation with near target fasting glycemic profile, she will not need prandial insulin for now.  -She is advised to continue Lantus 45 units SQ nightly, continue Victoza 1.8 mg SQ daily, and Metformin 750 mg ER po daily with breakfast.  -She is encouraged to monitor blood glucose at least twice daily, before breakfast and before bed and report readings less than  70 or greater than 200 for 3 tests in a row.  - Patient is warned not to take insulin without proper monitoring per orders.  2) BP/HTN:  Her blood pressure is controlled to target.  She is advised to continue Lisinopril 5 mg po daily  and Bisoprolol 5 mg po daily.  3) Lipids/HPL:  Her recent lipid panel on 04/06/20 shows controlled LDL of 86 and elevated triglycerides of 304.  She is advised to continue Atorvastatin 40 mg po daily at bedtime.  Side effects and precautions discussed with her.  Will recheck lipid panel prior to next visit.  4) Weight management- Her Body mass index is 41.92 kg/m.Marland Kitchen  She is a candidate for modest weight loss.  She would benefit from bariatric surgery.  She was given information in the past, did not initiate any process.  Will discuss again on subsequent visits.  Carbs and exercise regimen discussed in detail with her.    5) Chronic Care/Health Maintenance: -she is on ACEI/ARB and Statin medications and is encouraged to continue to follow up with Ophthalmology, Dentist, Podiatrist at least yearly or according to recommendations, and advised to stay away from smoking. I have recommended yearly flu vaccine and pneumonia vaccination at least every 5 years; moderate intensity exercise for up to 150 minutes weekly; and  sleep for at least 7 hours a day.  - I advised patient to maintain close follow up with Theressa Stamps, MD for primary care needs.  - Time spent on this patient care encounter:  40 min, of which > 50% was spent in  counseling and the rest reviewing her blood glucose logs , discussing her hypoglycemia and hyperglycemia episodes, reviewing her current and  previous labs / studies  ( including abstraction from other facilities) and medications  doses and developing a  long term treatment plan and documenting her care.   Please refer to Patient Instructions for Blood Glucose Monitoring and Insulin/Medications Dosing Guide"  in media tab for additional information. Please  also refer to " Patient Self Inventory" in the Media  tab for reviewed elements of pertinent patient history.  Virgel Bouquet participated in the discussions, expressed understanding, and voiced agreement with the above  plans.  All questions were answered to her satisfaction. she is encouraged to contact clinic should she have any questions or concerns prior to her return visit.  Follow up plan: - Return in about 6 months (around 07/16/2021) for Diabetes follow up- A1c and urine micro in office, Bring glucometer and logs, Previsit labs.  Rayetta Pigg, [facility] [address] [place], [place] [address] Phone: [phone] Fax: [phone]  01/16/2021, 3:03 PM

## 2021-02-13 ENCOUNTER — Telehealth: Payer: Self-pay | Admitting: "Endocrinology

## 2021-02-13 NOTE — Telephone Encounter (Signed)
Pt is calling and states that [employer] reached out to her informed her starting in May her victoza is going to be doubling in price and will need to know what else can she take in place as she can not afford this.   [facility] - [place], [place] - [address], [address] Phone:  [phone]  Fax:  [phone]

## 2021-02-13 NOTE — Telephone Encounter (Signed)
Any ideas? 

## 2021-02-13 NOTE — Telephone Encounter (Signed)
Once weekly Trulicity or Ozempic are some other choices.  Or she can even try the once daily pill Rybelsus.  Have her check and see if her insurance will cover any of these alternatives

## 2021-02-13 NOTE — Telephone Encounter (Signed)
Called patient back, had to leave a vm , need patient to advise what is on  her formulary for this

## 2021-02-14 NOTE — Telephone Encounter (Signed)
Patient returned call and stated that the new formulary list has not been released yet from the insurance company, it will probably be the end of April before they do, she was able to get another script filled at the current price so she is set on medication for a few months now. She will give [place] a call once she gets new formulary list.

## 2021-02-14 NOTE — Telephone Encounter (Signed)
Called patient to f/u to see if she has formulary listing or has had a chance to check to see what would be covered, no answer, left VM

## 2021-02-14 NOTE — Telephone Encounter (Signed)
Noted, thanks!

## 2021-04-15 ENCOUNTER — Other Ambulatory Visit: Payer: Self-pay | Admitting: "Endocrinology

## 2021-04-28 LAB — HM DIABETES EYE EXAM

## 2021-07-12 LAB — LIPID PANEL
Chol/HDL Ratio: 5.1 ratio — ABNORMAL HIGH (ref 0.0–4.4)
Cholesterol, Total: 153 mg/dL (ref 100–199)
HDL: 30 mg/dL — ABNORMAL LOW (ref >39)
LDL Chol Calc (NIH): 87 mg/dL (ref 0–99)
Triglycerides: 214 mg/dL — ABNORMAL HIGH (ref 0–149)
VLDL Cholesterol Cal: 36 mg/dL (ref 5–40)

## 2021-07-12 LAB — COMPREHENSIVE METABOLIC PANEL
ALT: 24 IU/L (ref 0–32)
AST: 26 IU/L (ref 0–40)
Albumin/Globulin Ratio: 1.7 (ref 1.2–2.2)
Albumin: 4.5 g/dL (ref 3.8–4.8)
Alkaline Phosphatase: 143 IU/L — ABNORMAL HIGH (ref 44–121)
BUN/Creatinine Ratio: 15 (ref 12–28)
BUN: 16 mg/dL (ref 8–27)
Bilirubin Total: 0.8 mg/dL (ref 0.0–1.2)
CO2: 22 mmol/L (ref 20–29)
Calcium: 9.6 mg/dL (ref 8.7–10.3)
Chloride: 102 mmol/L (ref 96–106)
Creatinine, Ser: 1.1 mg/dL — ABNORMAL HIGH (ref 0.57–1.00)
Globulin, Total: 2.6 g/dL (ref 1.5–4.5)
Glucose: 85 mg/dL (ref 65–99)
Potassium: 4.9 mmol/L (ref 3.5–5.2)
Sodium: 144 mmol/L (ref 134–144)
Total Protein: 7.1 g/dL (ref 6.0–8.5)
eGFR: 56 mL/min/{1.73_m2} — ABNORMAL LOW (ref >59)

## 2021-07-17 ENCOUNTER — Other Ambulatory Visit: Payer: Self-pay

## 2021-07-17 ENCOUNTER — Encounter: Payer: Self-pay | Admitting: Nurse Practitioner

## 2021-07-17 ENCOUNTER — Ambulatory Visit (INDEPENDENT_AMBULATORY_CARE_PROVIDER_SITE_OTHER): Payer: 59 | Admitting: Nurse Practitioner

## 2021-07-17 VITALS — BP 126/75 | HR 71 | Ht 64.0 in | Wt 238.0 lb

## 2021-07-17 DIAGNOSIS — Z794 Long term (current) use of insulin: Secondary | ICD-10-CM

## 2021-07-17 DIAGNOSIS — I10 Essential (primary) hypertension: Secondary | ICD-10-CM | POA: Diagnosis not present

## 2021-07-17 DIAGNOSIS — E559 Vitamin D deficiency, unspecified: Secondary | ICD-10-CM | POA: Diagnosis not present

## 2021-07-17 DIAGNOSIS — N1831 Chronic kidney disease, stage 3a: Secondary | ICD-10-CM

## 2021-07-17 DIAGNOSIS — E782 Mixed hyperlipidemia: Secondary | ICD-10-CM | POA: Diagnosis not present

## 2021-07-17 DIAGNOSIS — E1122 Type 2 diabetes mellitus with diabetic chronic kidney disease: Secondary | ICD-10-CM | POA: Diagnosis not present

## 2021-07-17 LAB — POCT UA - MICROALBUMIN
Albumin/Creatinine Ratio, Urine, POC: <30
Creatinine, POC: 300 mg/dL
Microalbumin Ur, POC: 30 mg/L

## 2021-07-17 LAB — POCT GLYCOSYLATED HEMOGLOBIN (HGB A1C): Hemoglobin A1C: 6.3 % — AB (ref 4.0–5.6)

## 2021-07-17 NOTE — Patient Instructions (Signed)
Advice for Weight Management  -For most of us the best way to lose weight is by diet management. Generally speaking, diet management means consuming less calories intentionally which over time brings about progressive weight loss.  This can be achieved more effectively by restricting carbohydrate consumption to the minimum possible.  So, it is critically important to know your numbers: how much calorie you are consuming and how much calorie you need. More importantly, our carbohydrates sources should be unprocessed or minimally processed complex starch food items.   Sometimes, it is important to balance nutrition by increasing protein intake (animal or plant source), fruits, and vegetables.  -Sticking to a routine mealtime to eat 3 meals a day and avoiding unnecessary snacks is shown to have a big role in weight control. Under normal circumstances, the only time we lose real weight is when we are hungry, so allow hunger to take place- hunger means no food between meal times, only water.  It is not advisable to starve.   -It is better to avoid simple carbohydrates including: Cakes, Sweet Desserts, Ice Cream, Soda (diet and regular), Sweet Tea, Candies, Chips, Cookies, Store Bought Juices, Alcohol in Excess of  1-2 drinks a day, Artificial Sweeteners, Doughnuts, Coffee Creamers, "Sugar-free" Products, etc, etc.  This is not a complete list.....    -Consulting with certified diabetes educators is proven to provide you with the most accurate and current information on diet.  Also, you may be  interested in discussing diet options/exchanges , we can schedule a visit with Alyssa Fox, RDN, CDE for individualized nutrition education.  -Exercise: If you are able: 30 -60 minutes a day ,4 days a week, or 150 minutes a week.  The longer the better.  Combine stretch, strength, and aerobic activities.  If you were told in the past that you have high risk for cardiovascular diseases, you may seek evaluation by  your heart doctor prior to initiating moderate to intense exercise programs.    

## 2021-07-17 NOTE — Progress Notes (Signed)
07/17/2021, 10:53 AM                 Endocrinology follow-up note   Subjective:    [name], female    DOB: Apr 04, 1956.  Alyssa Fox is being seen in follow-up  for management of currently uncontrolled symptomatic 2 diabetes, hyperlipidemia, hypertension, obesity. PMD:   Theressa Stamps, MD.   Past Medical History:  Diagnosis Date   DM type 2 causing vascular disease ([place])    Hypertension    MI (myocardial infarction) ([place])    Past Surgical History:  Procedure Laterality Date   CHOLECYSTECTOMY     CORONARY ANGIOPLASTY WITH STENT PLACEMENT     TUBAL LIGATION     Social History   Socioeconomic History   Marital status: Married    Spouse name: Not on file   Number of children: Not on file   Years of education: Not on file   Highest education level: Not on file  Occupational History   Not on file  Tobacco Use   Smoking status: Never   Smokeless tobacco: Never  Vaping Use   Vaping Use: Never used  Substance and Sexual Activity   Alcohol use: No   Drug use: No   Sexual activity: Not on file  Other Topics Concern   Not on file  Social History Narrative   Not on file   Social Determinants of Health   Financial Resource Strain: Not on file  Food Insecurity: Not on file  Transportation Needs: Not on file  Physical Activity: Not on file  Stress: Not on file  Social Connections: Not on file   Outpatient Encounter Medications as of 07/17/2021  Medication Sig   allopurinol (ZYLOPRIM) 100 MG tablet every morning.   aspirin EC 81 MG tablet Take by mouth daily.   atorvastatin (LIPITOR) 40 MG tablet TAKE 1 TABLET BY MOUTH  DAILY   azelastine (OPTIVAR) 0.05 % ophthalmic solution Apply 1 drop to eye 2 (two) times daily.   bisoprolol (ZEBETA) 5 MG tablet Take 5 mg by mouth daily.   clopidogrel (PLAVIX) 75 MG tablet 1 tablet daily.   gabapentin (NEURONTIN) 100 MG capsule Take 1 capsule (100 mg total) by mouth 3  (three) times daily.   insulin glargine (LANTUS SOLOSTAR) 100 UNIT/ML Solostar Pen INJECT SUBCUTANEOUSLY 45 UNITS AT BEDTIME   Insulin Pen Fox (PEN NEEDLES) 31G X 8 MM MISC Use as directed twice daily   liraglutide (VICTOZA) 18 MG/3ML SOPN Inject 0.3 mLs (1.8 mg total) into the skin at bedtime.   lisinopril (ZESTRIL) 5 MG tablet TAKE 1 TABLET BY MOUTH  DAILY   meloxicam (MOBIC) 15 MG tablet Take 15 mg by mouth daily.   metFORMIN (GLUCOPHAGE-XR) 750 MG 24 hr tablet TAKE 1 TABLET BY MOUTH  DAILY WITH BREAKFAST   nitroGLYCERIN (NITROSTAT) 0.4 MG SL tablet Place 0.4 mg under the tongue every 5 (five) minutes as needed for chest pain.   ONETOUCH ULTRA test strip 1 each 3 (three) times daily.   SV MELATONIN 5 MG TABS Take 5 mg by mouth daily.   No facility-administered encounter medications on file as of 07/17/2021.    ALLERGIES: Allergies  Allergen Reactions   Latex     VACCINATION STATUS:  There is no immunization history on file for this patient.  Diabetes She presents for her follow-up diabetic visit. She has type 2 diabetes mellitus. Onset  time: She was diagnosed at approximate age of 61 years. Her disease course has been improving. There are no hypoglycemic associated symptoms. Pertinent negatives for hypoglycemia include no confusion, headaches, pallor or seizures. Pertinent negatives for diabetes include no chest pain, no polydipsia, no polyphagia and no polyuria. There are no hypoglycemic complications. Symptoms are improving. Diabetic complications include heart disease and peripheral neuropathy. (She is status post 3 stent placement for coronary artery disease and December 2015-about the time of her diagnosis with diabetes.) Risk factors for coronary artery disease include dyslipidemia, diabetes mellitus, family history, obesity, hypertension, post-menopausal and sedentary lifestyle. Current diabetic treatment includes insulin injections and oral agent (monotherapy). She is compliant  with treatment most of the time. Her weight is decreasing steadily. She is following a generally healthy diet. When asked about meal planning, she reported none. She has not had a previous visit with a dietitian. She rarely participates in exercise. Her home blood glucose trend is fluctuating minimally. Her breakfast blood glucose range is generally 90-110 mg/dl. Her bedtime blood glucose range is generally 140-180 mg/dl. (She presents today with her CGM, meters, and logs showing at goal fasting and postprandial glycemic profile.  Her POCT A1c today is 6.3%, improving since last visit of 6.7%.  Analysis of her CGM shows TIR 93%, TAR 4%, TBR 2%.  She denies any significant hypoglycemia.  She does report she had to take prednisone in between visits.) An ACE inhibitor/angiotensin II receptor blocker is being taken. She does not see a podiatrist.Eye exam is current.  Hyperlipidemia This is a chronic problem. The current episode started more than 1 year ago. The problem is controlled. Recent lipid tests were reviewed and are variable. Exacerbating diseases include chronic renal disease, diabetes and obesity. Factors aggravating her hyperlipidemia include beta blockers and fatty foods. Pertinent negatives include no chest pain, myalgias or shortness of breath. Current antihyperlipidemic treatment includes statins. The current treatment provides moderate improvement of lipids. Compliance problems include adherence to diet and adherence to exercise.  Risk factors for coronary artery disease include hypertension, dyslipidemia, a sedentary lifestyle, post-menopausal, diabetes mellitus and obesity.  Hypertension This is a chronic problem. The current episode started more than 1 year ago. The problem has been gradually improving since onset. The problem is controlled. Pertinent negatives include no chest pain, headaches, palpitations or shortness of breath. There are no associated agents to hypertension. Risk factors for  coronary artery disease include dyslipidemia, diabetes mellitus, family history, post-menopausal state, obesity and sedentary lifestyle. Past treatments include ACE inhibitors and beta blockers. The current treatment provides moderate improvement. There are no compliance problems.  Hypertensive end-organ damage includes kidney disease. Identifiable causes of hypertension include chronic renal disease.   Review of systems  Constitutional: + Minimally fluctuating body weight,  current Body mass index is 40.85 kg/m. , no fatigue, no subjective hyperthermia, no subjective hypothermia Eyes: no blurry vision, no xerophthalmia ENT: no sore throat, no nodules palpated in throat, no dysphagia/odynophagia, no hoarseness Cardiovascular: no chest pain, no shortness of breath, no palpitations, no leg swelling Respiratory: no cough, no shortness of breath Gastrointestinal: no nausea/vomiting/diarrhea Musculoskeletal: no muscle/joint aches Skin: no rashes, no hyperemia Neurological: no tremors, no numbness, no tingling, no dizziness Psychiatric: no depression, no anxiety   Objective:    BP 126/75   Pulse 71   Ht _0  (1.626 m)   Wt 238 lb (108 kg)   BMI 40.85 kg/m   Wt Readings from Last 3 Encounters:  07/17/21 238 lb (108 kg)  01/16/21 244 lb 3.2 oz (110.8 kg)  08/11/20 248 lb 3.2 oz (112.6 kg)      BP Readings from Last 3 Encounters:  07/17/21 126/75  01/16/21 106/70  08/11/20 115/76     Physical Exam- Limited  Constitutional:  Body mass index is 40.85 kg/m. , not in acute distress, normal state of mind Eyes:  EOMI, no exophthalmos Neck: Supple Cardiovascular: RRR, no murmurs, rubs, or gallops, no edema Respiratory: Adequate breathing efforts, no crackles, rales, rhonchi, or wheezing Musculoskeletal: no gross deformities, strength intact in all four extremities, no gross restriction of joint movements Skin:  no rashes, no hyperemia Neurological: no tremor with outstretched  hands    Recent Results (from the past 2160 hour(s))  HM DIABETES EYE EXAM     Status: None   Collection Time: 04/28/21 12:00 AM  Result Value Ref Range   HM Diabetic Eye Exam No Retinopathy No Retinopathy  Comprehensive metabolic panel     Status: Abnormal   Collection Time: 07/11/21  9:12 AM  Result Value Ref Range   Glucose 85 65 - 99 mg/dL   BUN 16 8 - 27 mg/dL   Creatinine, Ser 1.10 (H) 0.57 - 1.00 mg/dL   eGFR 56 (L) >59 mL/min/1.73   BUN/Creatinine Ratio 15 12 - 28   Sodium 144 134 - 144 mmol/L   Potassium 4.9 3.5 - 5.2 mmol/L   Chloride 102 96 - 106 mmol/L   CO2 22 20 - 29 mmol/L   Calcium 9.6 8.7 - 10.3 mg/dL   Total Protein 7.1 6.0 - 8.5 g/dL   Albumin 4.5 3.8 - 4.8 g/dL   Globulin, Total 2.6 1.5 - 4.5 g/dL   Albumin/Globulin Ratio 1.7 1.2 - 2.2   Bilirubin Total 0.8 0.0 - 1.2 mg/dL   Alkaline Phosphatase 143 (H) 44 - 121 IU/L   AST 26 0 - 40 IU/L   ALT 24 0 - 32 IU/L  Lipid panel     Status: Abnormal   Collection Time: 07/11/21  9:12 AM  Result Value Ref Range   Cholesterol, Total 153 100 - 199 mg/dL   Triglycerides 214 (H) 0 - 149 mg/dL   HDL 30 (L) >39 mg/dL   VLDL Cholesterol Cal 36 5 - 40 mg/dL   LDL Chol Calc (NIH) 87 0 - 99 mg/dL   Chol/HDL Ratio 5.1 (H) 0.0 - 4.4 ratio    Comment:                                   T. Chol/HDL Ratio                                             Men  Women                               1/2 Avg.Risk  3.4    3.3                                   Avg.Risk  5.0    4.4  2X Avg.Risk  9.6    7.1                                3X Avg.Risk 23.4   11.0   HgB A1c     Status: Abnormal   Collection Time: 07/17/21 10:30 AM  Result Value Ref Range   Hemoglobin A1C 6.3 (A) 4.0 - 5.6 %   HbA1c POC (<> result, manual entry)     HbA1c, POC (prediabetic range)     HbA1c, POC (controlled diabetic range)    POCT UA - Microalbumin     Status: Normal   Collection Time: 07/17/21 10:30 AM  Result Value Ref  Range   Microalbumin Ur, POC 30 mg/L   Creatinine, POC 300 mg/dL   Albumin/Creatinine Ratio, Urine, POC <30    Lipid Panel     Component Value Date/Time   CHOL 153 07/11/2021 0912   TRIG 214 (H) 07/11/2021 0912   HDL 30 (L) 07/11/2021 0912   CHOLHDL 5.1 (H) 07/11/2021 0912   CHOLHDL 4.8 04/06/2020 1132   LDLCALC 87 07/11/2021 0912   LDLCALC 86 04/06/2020 1132   LABVLDL 36 07/11/2021 0912      Assessment & Plan:   1) Type 2 diabetes mellitus with stage 3 chronic kidney disease, with long-term current use of insulin ([facility])  - Alyssa Fox has currently uncontrolled symptomatic type 2 DM since 65 years of age.  She presents today with her CGM, meters, and logs showing at goal fasting and postprandial glycemic profile.  Her POCT A1c today is 6.3%, improving since last visit of 6.7%.  Analysis of her CGM shows TIR 93%, TAR 4%, TBR 2%.  She denies any significant hypoglycemia.  She does report she had to take prednisone in between visits.  - Recent labs reviewed, stable stage 3 renal insufficiency.  -her diabetes is complicated by coronary artery disease which required stent placement in 2015, renal insufficiency, obesity/sedentary life and Alyssa Fox remains at a high risk for more acute and chronic complications which include CAD, CVA, CKD, retinopathy, and neuropathy. These are all discussed in detail with the patient.  - Nutritional counseling repeated at each appointment due to patients tendency to fall back in to old habits.  - The patient admits there is a room for improvement in their diet and drink choices. -  Suggestion is made for the patient to avoid simple carbohydrates from their diet including Cakes, Sweet Desserts / Pastries, Ice Cream, Soda (diet and regular), Sweet Tea, Candies, Chips, Cookies, Sweet Pastries, Store Bought Juices, Alcohol in Excess of 1-2 drinks a day, Artificial Sweeteners, Coffee Creamer, and "Sugar-free" Products. This will help patient  to have stable blood glucose profile and potentially avoid unintended weight gain.   - I encouraged the patient to switch to unprocessed or minimally processed complex starch and increased protein intake (animal or plant source), fruits, and vegetables.   - Patient is advised to stick to a routine mealtimes to eat 3 meals a day and avoid unnecessary snacks (to snack only to correct hypoglycemia).  - I have approached her with the following individualized plan to manage diabetes and patient agrees:   -Based on her presentation with controlled glycemic profile, no changes will be made to her medication regimen today. She is advised to continue Lantus 45 units SQ nightly, continue Victoza 1.8 mg SQ daily, and Metformin 750 mg ER po daily with breakfast.  -  She is encouraged to monitor blood glucose at least twice daily(using her CGM provided for her by her employer), before breakfast and before bed and report readings less than 70 or greater than 200 for 3 tests in a row.  - Patient is warned not to take insulin without proper monitoring per orders.  2) BP/HTN:  Her blood pressure is controlled to target.  She is advised to continue Lisinopril 5 mg po daily and Bisoprolol 5 mg po daily.  3) Lipids/HPL:  Her recent lipid panel on 07/11/21 shows controlled LDL of 887 and elevated triglycerides of 214 (improving).  She is advised to continue Atorvastatin 40 mg po daily at bedtime.  Side effects and precautions discussed with her.    4) Weight management- Her Body mass index is 40.85 kg/m.Marland Kitchen  She is a candidate for modest weight loss and continues to work hard towards this.     5) Chronic Care/Health Maintenance: -she is on ACEI/ARB and Statin medications and is encouraged to continue to follow up with Ophthalmology, Dentist, Podiatrist at least yearly or according to recommendations, and advised to stay away from smoking. I have recommended yearly flu vaccine and pneumonia vaccination at least every 5  years; moderate intensity exercise for up to 150 minutes weekly; and  sleep for at least 7 hours a day.  - I advised patient to maintain close follow up with Theressa Stamps, MD for primary care needs.    I spent 44 minutes in the care of the patient today including review of labs from [facility], Lipids, Thyroid Function, Hematology (current and previous including abstractions from other facilities); face-to-face time discussing  her blood glucose readings/logs, discussing hypoglycemia and hyperglycemia episodes and symptoms, medications doses, her options of short and long term treatment based on the latest standards of care / guidelines;  discussion about incorporating lifestyle medicine;  and documenting the encounter.    Please refer to Patient Instructions for Blood Glucose Monitoring and Insulin/Medications Dosing Guide"  in media tab for additional information. Please  also refer to " Patient Self Inventory" in the Media  tab for reviewed elements of pertinent patient history.  Alyssa Fox participated in the discussions, expressed understanding, and voiced agreement with the above plans.  All questions were answered to her satisfaction. she is encouraged to contact clinic should she have any questions or concerns prior to her return visit.  Follow up plan: - Return in about 6 months (around 01/17/2022) for Diabetes F/U with A1c in office, Previsit labs, Bring meter and logs.  Alyssa Fox, [facility] [address] [place], [place] [address] Phone: [phone] Fax: [phone]  07/17/2021, 10:53 AM

## 2021-07-18 ENCOUNTER — Other Ambulatory Visit: Payer: Self-pay | Admitting: "Endocrinology

## 2021-09-20 ENCOUNTER — Other Ambulatory Visit: Payer: Self-pay | Admitting: "Endocrinology

## 2021-09-25 ENCOUNTER — Other Ambulatory Visit: Payer: Self-pay | Admitting: "Endocrinology

## 2021-09-25 DIAGNOSIS — N1831 Chronic kidney disease, stage 3a: Secondary | ICD-10-CM

## 2021-09-25 DIAGNOSIS — Z794 Long term (current) use of insulin: Secondary | ICD-10-CM

## 2021-10-09 ENCOUNTER — Other Ambulatory Visit: Payer: Self-pay

## 2021-10-09 NOTE — Telephone Encounter (Signed)
Wrong chart opened

## 2021-10-10 ENCOUNTER — Other Ambulatory Visit: Payer: Self-pay | Admitting: Nurse Practitioner

## 2021-10-10 DIAGNOSIS — N1831 Chronic kidney disease, stage 3a: Secondary | ICD-10-CM

## 2021-10-10 DIAGNOSIS — E1122 Type 2 diabetes mellitus with diabetic chronic kidney disease: Secondary | ICD-10-CM

## 2021-10-10 MED ORDER — GABAPENTIN 100 MG PO CAPS: 100.0000 mg | ORAL_CAPSULE | Freq: Three times a day (TID) | ORAL | 0 refills | Status: DC

## 2021-10-24 ENCOUNTER — Other Ambulatory Visit: Payer: Self-pay | Admitting: Nurse Practitioner

## 2021-10-24 DIAGNOSIS — Z794 Long term (current) use of insulin: Secondary | ICD-10-CM

## 2021-10-24 DIAGNOSIS — N1831 Chronic kidney disease, stage 3a: Secondary | ICD-10-CM

## 2021-12-29 ENCOUNTER — Other Ambulatory Visit: Payer: Self-pay | Admitting: Nurse Practitioner

## 2021-12-29 DIAGNOSIS — Z794 Long term (current) use of insulin: Secondary | ICD-10-CM

## 2022-01-12 LAB — COMPREHENSIVE METABOLIC PANEL
ALT: 28 IU/L (ref 0–32)
AST: 25 IU/L (ref 0–40)
Albumin/Globulin Ratio: 1.9 (ref 1.2–2.2)
Albumin: 4.6 g/dL (ref 3.8–4.8)
Alkaline Phosphatase: 153 IU/L — ABNORMAL HIGH (ref 44–121)
BUN/Creatinine Ratio: 27 (ref 12–28)
BUN: 31 mg/dL — ABNORMAL HIGH (ref 8–27)
Bilirubin Total: 0.9 mg/dL (ref 0.0–1.2)
CO2: 24 mmol/L (ref 20–29)
Calcium: 9.8 mg/dL (ref 8.7–10.3)
Chloride: 100 mmol/L (ref 96–106)
Creatinine, Ser: 1.15 mg/dL — ABNORMAL HIGH (ref 0.57–1.00)
Globulin, Total: 2.4 g/dL (ref 1.5–4.5)
Glucose: 138 mg/dL — ABNORMAL HIGH (ref 70–99)
Potassium: 4.6 mmol/L (ref 3.5–5.2)
Sodium: 139 mmol/L (ref 134–144)
Total Protein: 7 g/dL (ref 6.0–8.5)
eGFR: 53 mL/min/{1.73_m2} — ABNORMAL LOW (ref >59)

## 2022-01-12 LAB — TSH: TSH: 1.34 u[IU]/mL (ref 0.450–4.500)

## 2022-01-12 LAB — T4, FREE: Free T4: 1.15 ng/dL (ref 0.82–1.77)

## 2022-01-12 LAB — VITAMIN D 25 HYDROXY (VIT D DEFICIENCY, FRACTURES): Vit D, 25-Hydroxy: 17.7 ng/mL — ABNORMAL LOW (ref 30.0–100.0)

## 2022-01-17 ENCOUNTER — Encounter: Payer: Self-pay | Admitting: Nurse Practitioner

## 2022-01-17 ENCOUNTER — Other Ambulatory Visit: Payer: Self-pay

## 2022-01-17 ENCOUNTER — Ambulatory Visit (INDEPENDENT_AMBULATORY_CARE_PROVIDER_SITE_OTHER): Payer: 59 | Admitting: Nurse Practitioner

## 2022-01-17 VITALS — BP 107/69 | HR 71 | Ht 64.0 in | Wt 245.2 lb

## 2022-01-17 DIAGNOSIS — E559 Vitamin D deficiency, unspecified: Secondary | ICD-10-CM

## 2022-01-17 DIAGNOSIS — N1831 Chronic kidney disease, stage 3a: Secondary | ICD-10-CM

## 2022-01-17 DIAGNOSIS — Z794 Long term (current) use of insulin: Secondary | ICD-10-CM

## 2022-01-17 DIAGNOSIS — I10 Essential (primary) hypertension: Secondary | ICD-10-CM

## 2022-01-17 DIAGNOSIS — E782 Mixed hyperlipidemia: Secondary | ICD-10-CM

## 2022-01-17 DIAGNOSIS — E1122 Type 2 diabetes mellitus with diabetic chronic kidney disease: Secondary | ICD-10-CM

## 2022-01-17 LAB — POCT GLYCOSYLATED HEMOGLOBIN (HGB A1C): HbA1c, POC (controlled diabetic range): 6.2 % (ref 0.0–7.0)

## 2022-01-17 MED ORDER — OZEMPIC (0.25 OR 0.5 MG/DOSE) 2 MG/1.5ML ~~LOC~~ SOPN: 0.5000 mg | PEN_INJECTOR | SUBCUTANEOUS | 3 refills | Status: DC

## 2022-01-17 MED ORDER — VITAMIN D (ERGOCALCIFEROL) 1.25 MG (50000 UNIT) PO CAPS: 50000.0000 [IU] | ORAL_CAPSULE | ORAL | 0 refills | Status: DC

## 2022-01-17 NOTE — Patient Instructions (Signed)

## 2022-01-17 NOTE — Progress Notes (Signed)
01/17/2022, 8:54 AM                 Endocrinology follow-up note   Subjective:    [name], female    DOB: 07-06-1956.  Alyssa Fox is being seen in follow-up  for management of currently uncontrolled symptomatic 2 diabetes, hyperlipidemia, hypertension, obesity. PMD:   Theressa Stamps, MD.   Past Medical History:  Diagnosis Date   DM type 2 causing vascular disease ([place])    Hypertension    MI (myocardial infarction) ([place])    Past Surgical History:  Procedure Laterality Date   CHOLECYSTECTOMY     CORONARY ANGIOPLASTY WITH STENT PLACEMENT     TUBAL LIGATION     Social History   Socioeconomic History   Marital status: Widowed    Spouse name: Not on file   Number of children: Not on file   Years of education: Not on file   Highest education level: Not on file  Occupational History   Not on file  Tobacco Use   Smoking status: Never   Smokeless tobacco: Never  Vaping Use   Vaping Use: Never used  Substance and Sexual Activity   Alcohol use: No   Drug use: No   Sexual activity: Not on file  Other Topics Concern   Not on file  Social History Narrative   Not on file   Social Determinants of Health   Financial Resource Strain: Not on file  Food Insecurity: Not on file  Transportation Needs: Not on file  Physical Activity: Not on file  Stress: Not on file  Social Connections: Not on file   Outpatient Encounter Medications as of 01/17/2022  Medication Sig   allopurinol (ZYLOPRIM) 100 MG tablet every morning.   aspirin EC 81 MG tablet Take by mouth daily.   atorvastatin (LIPITOR) 40 MG tablet TAKE 1 TABLET BY MOUTH  DAILY   bisoprolol (ZEBETA) 5 MG tablet Take 5 mg by mouth daily.   clopidogrel (PLAVIX) 75 MG tablet 1 tablet daily.   gabapentin (NEURONTIN) 100 MG capsule TAKE 1 CAPSULE BY MOUTH 3 TIMES  DAILY   insulin glargine (LANTUS SOLOSTAR) 100 UNIT/ML Solostar Pen INJECT SUBCUTANEOUSLY 45 UNITS  AT BEDTIME   Insulin Pen Needle (PEN NEEDLES) 31G X 8 MM MISC Use as directed twice daily   lisinopril (ZESTRIL) 5 MG tablet TAKE 1 TABLET BY MOUTH  DAILY   nitroGLYCERIN (NITROSTAT) 0.4 MG SL tablet Place 0.4 mg under the tongue every 5 (five) minutes as needed for chest pain.   ONETOUCH ULTRA test strip 1 each 3 (three) times daily.   Semaglutide,0.25 or 0.5MG /DOS, (OZEMPIC, 0.25 OR 0.5 MG/DOSE,) 2 MG/1.5ML SOPN Inject 0.5 mg into the skin once a week.   Vitamin D, Ergocalciferol, (DRISDOL) 1.25 MG (50000 UNIT) CAPS capsule Take 1 capsule (50,000 Units total) by mouth every 7 (seven) days.   [DISCONTINUED] liraglutide (VICTOZA) 18 MG/3ML SOPN Inject 0.3 mLs (1.8 mg total) into the skin at bedtime.   [DISCONTINUED] metFORMIN (GLUCOPHAGE-XR) 750 MG 24 hr tablet TAKE 1 TABLET BY MOUTH  DAILY WITH BREAKFAST   [DISCONTINUED] azelastine (OPTIVAR) 0.05 % ophthalmic solution Apply 1 drop to eye 2 (two) times daily.   [DISCONTINUED] meloxicam (MOBIC) 15 MG tablet Take 15 mg by mouth daily. (Patient not taking: Reported on 01/17/2022)   [DISCONTINUED] SV MELATONIN 5 MG TABS Take 5 mg by mouth daily.  No facility-administered encounter medications on file as of 01/17/2022.    ALLERGIES: Allergies  Allergen Reactions   Latex Swelling   Tape Swelling    Other reaction(s): Unknown    VACCINATION STATUS:  There is no immunization history on file for this patient.  Diabetes She presents for her follow-up diabetic visit. She has type 2 diabetes mellitus. Onset time: She was diagnosed at approximate age of 51 years. Her disease course has been stable. There are no hypoglycemic associated symptoms. Pertinent negatives for hypoglycemia include no confusion, headaches, pallor or seizures. Pertinent negatives for diabetes include no chest pain, no polydipsia, no polyphagia and no polyuria. There are no hypoglycemic complications. Symptoms are stable. Diabetic complications include heart disease and  peripheral neuropathy. (She is status post 3 stent placement for coronary artery disease and December 2015-about the time of her diagnosis with diabetes.) Risk factors for coronary artery disease include dyslipidemia, diabetes mellitus, family history, obesity, hypertension, post-menopausal and sedentary lifestyle. Current diabetic treatment includes insulin injections and oral agent (monotherapy) (and Victoza). She is compliant with treatment most of the time. Her weight is fluctuating minimally. She is following a generally healthy diet. When asked about meal planning, she reported none. She has not had a previous visit with a dietitian. She rarely participates in exercise. Her home blood glucose trend is fluctuating minimally. Her breakfast blood glucose range is generally 90-110 mg/dl. Her bedtime blood glucose range is generally 140-180 mg/dl. (She presents today with her CGM and meter showing at target fasting and postprandial glycemic profile.  Her POCT A1c today is 6.2%, essentially unchanged from previous visit.  She denies any significant hypoglycemia.  Analysis of her CGM shows TIR 99%, TAR <1%, TBR <1%.  She is asking about Ozempic today.) An ACE inhibitor/angiotensin II receptor blocker is being taken. She does not see a podiatrist.Eye exam is current.  Hyperlipidemia This is a chronic problem. The current episode started more than 1 year ago. The problem is controlled. Recent lipid tests were reviewed and are variable. Exacerbating diseases include chronic renal disease, diabetes and obesity. Factors aggravating her hyperlipidemia include beta blockers and fatty foods. Pertinent negatives include no chest pain, myalgias or shortness of breath. Current antihyperlipidemic treatment includes statins. The current treatment provides moderate improvement of lipids. Compliance problems include adherence to diet and adherence to exercise.  Risk factors for coronary artery disease include hypertension,  dyslipidemia, a sedentary lifestyle, post-menopausal, diabetes mellitus and obesity.  Hypertension This is a chronic problem. The current episode started more than 1 year ago. The problem has been gradually improving since onset. The problem is controlled. Pertinent negatives include no chest pain, headaches, palpitations or shortness of breath. There are no associated agents to hypertension. Risk factors for coronary artery disease include dyslipidemia, diabetes mellitus, family history, post-menopausal state, obesity and sedentary lifestyle. Past treatments include ACE inhibitors and beta blockers. The current treatment provides moderate improvement. There are no compliance problems.  Hypertensive end-organ damage includes kidney disease. Identifiable causes of hypertension include chronic renal disease.   Review of systems  Constitutional: + Minimally fluctuating body weight,  current Body mass index is 42.09 kg/m. , no fatigue, no subjective hyperthermia, no subjective hypothermia Eyes: no blurry vision, no xerophthalmia ENT: no sore throat, no nodules palpated in throat, no dysphagia/odynophagia, no hoarseness Cardiovascular: no chest pain, no shortness of breath, no palpitations, no leg swelling Respiratory: no cough, no shortness of breath Gastrointestinal: no nausea/vomiting/diarrhea Musculoskeletal: no muscle/joint aches Skin: no rashes, no hyperemia Neurological:  no tremors, no numbness, no tingling, no dizziness Psychiatric: no depression, no anxiety   Objective:    BP 107/69    Pulse 71    Ht $R'5\' 4"'mN$  (1.626 m)    Wt 245 lb 3.2 oz (111.2 kg)    SpO2 96%    BMI 42.09 kg/m   Wt Readings from Last 3 Encounters:  01/17/22 245 lb 3.2 oz (111.2 kg)  07/17/21 238 lb (108 kg)  01/16/21 244 lb 3.2 oz (110.8 kg)      BP Readings from Last 3 Encounters:  01/17/22 107/69  07/17/21 126/75  01/16/21 106/70     Physical Exam- Limited  Constitutional:  Body mass index is 42.09 kg/m. ,  not in acute distress, normal state of mind Eyes:  EOMI, no exophthalmos Neck: Supple Cardiovascular: RRR, no murmurs, rubs, or gallops, no edema Respiratory: Adequate breathing efforts, no crackles, rales, rhonchi, or wheezing Musculoskeletal: no gross deformities, strength intact in all four extremities, no gross restriction of joint movements Skin:  no rashes, no hyperemia Neurological: no tremor with outstretched hands    Recent Results (from the past 2160 hour(s))  Comprehensive metabolic panel     Status: Abnormal   Collection Time: 01/11/22  8:00 AM  Result Value Ref Range   Glucose 138 (H) 70 - 99 mg/dL   BUN 31 (H) 8 - 27 mg/dL   Creatinine, Ser 1.15 (H) 0.57 - 1.00 mg/dL   eGFR 53 (L) >59 mL/min/1.73   BUN/Creatinine Ratio 27 12 - 28   Sodium 139 134 - 144 mmol/L   Potassium 4.6 3.5 - 5.2 mmol/L   Chloride 100 96 - 106 mmol/L   CO2 24 20 - 29 mmol/L   Calcium 9.8 8.7 - 10.3 mg/dL   Total Protein 7.0 6.0 - 8.5 g/dL   Albumin 4.6 3.8 - 4.8 g/dL   Globulin, Total 2.4 1.5 - 4.5 g/dL   Albumin/Globulin Ratio 1.9 1.2 - 2.2   Bilirubin Total 0.9 0.0 - 1.2 mg/dL   Alkaline Phosphatase 153 (H) 44 - 121 IU/L   AST 25 0 - 40 IU/L   ALT 28 0 - 32 IU/L  VITAMIN D 25 Hydroxy (Vit-D Deficiency, Fractures)     Status: Abnormal   Collection Time: 01/11/22  8:00 AM  Result Value Ref Range   Vit D, 25-Hydroxy 17.7 (L) 30.0 - 100.0 ng/mL    Comment: Vitamin D deficiency has been defined by the Institute of Medicine and an Endocrine Society practice guideline as a level of serum 25-OH vitamin D less than 20 ng/mL (1,2). The Endocrine Society went on to further define vitamin D insufficiency as a level between 21 and 29 ng/mL (2). 1. IOM (Institute of Medicine). 2010. Dietary reference    intakes for calcium and D. [place]: The    [employer]. 2. Holick MF, [place], Bischoff-Ferrari HA, et al.    Evaluation, treatment, and prevention of vitamin D     deficiency: an Endocrine Society clinical practice    guideline. JCEM. 2011 Jul; 96(7):1911-30.   T4, free     Status: None   Collection Time: 01/11/22  8:00 AM  Result Value Ref Range   Free T4 1.15 0.82 - 1.77 ng/dL  TSH     Status: None   Collection Time: 01/11/22  8:00 AM  Result Value Ref Range   TSH 1.340 0.450 - 4.500 uIU/mL  POCT glycosylated hemoglobin (Hb A1C)     Status: Normal   Collection  Time: 01/17/22  8:40 AM  Result Value Ref Range   Hemoglobin A1C     HbA1c POC (<> result, manual entry)     HbA1c, POC (prediabetic range)     HbA1c, POC (controlled diabetic range) 6.2 0.0 - 7.0 %   Lipid Panel     Component Value Date/Time   CHOL 153 07/11/2021 0912   TRIG 214 (H) 07/11/2021 0912   HDL 30 (L) 07/11/2021 0912   CHOLHDL 5.1 (H) 07/11/2021 0912   CHOLHDL 4.8 04/06/2020 1132   LDLCALC 87 07/11/2021 0912   LDLCALC 86 04/06/2020 1132   LABVLDL 36 07/11/2021 0912      Assessment & Plan:   1) Type 2 diabetes mellitus with stage 3 chronic kidney disease, with long-term current use of insulin (HCC)  - Alyssa Fox has currently uncontrolled symptomatic type 2 DM since 66 years of age.  She presents today with her CGM and meter showing at target fasting and postprandial glycemic profile.  Her POCT A1c today is 6.2%, essentially unchanged from previous visit.  She denies any significant hypoglycemia.  Analysis of her CGM shows TIR 99%, TAR <1%, TBR <1%.  She is asking about Ozempic today.  - Recent labs reviewed, stable stage 3 renal insufficiency.  -her diabetes is complicated by coronary artery disease which required stent placement in 2015, renal insufficiency, obesity/sedentary life and Alyssa Fox remains at a high risk for more acute and chronic complications which include CAD, CVA, CKD, retinopathy, and neuropathy. These are all discussed in detail with the patient.  - Nutritional counseling repeated at each appointment due to patients tendency  to fall back in to old habits.  - The patient admits there is a room for improvement in their diet and drink choices. -  Suggestion is made for the patient to avoid simple carbohydrates from their diet including Cakes, Sweet Desserts / Pastries, Ice Cream, Soda (diet and regular), Sweet Tea, Candies, Chips, Cookies, Sweet Pastries, Store Bought Juices, Alcohol in Excess of 1-2 drinks a day, Artificial Sweeteners, Coffee Creamer, and "Sugar-free" Products. This will help patient to have stable blood glucose profile and potentially avoid unintended weight gain.   - I encouraged the patient to switch to unprocessed or minimally processed complex starch and increased protein intake (animal or plant source), fruits, and vegetables.   - Patient is advised to stick to a routine mealtimes to eat 3 meals a day and avoid unnecessary snacks (to snack only to correct hypoglycemia).  - I have approached her with the following individualized plan to manage diabetes and patient agrees:   -Based on her presentation with controlled glycemic profile, will try deescalating her regimen.  Will stop her Metformin for now given stage 3 kidney disease.  She can continue her Lantus at 45 units SQ nightly.  I also discussed and changed her from Victoza to [place] to aide more in her weight loss attempts.  She is advised to use up her current supply of Victoza then switch to Ozempic 0.25 mg SQ weekly x 2 weeks, then advance to 0.5 mg SQ weekly thereafter if tolerated well.   -She is encouraged to monitor blood glucose at least twice daily(using her CGM provided for her by her employer), before breakfast and before bed and report readings less than 70 or greater than 200 for 3 tests in a row.  - Patient is warned not to take insulin without proper monitoring per orders.  2) BP/HTN:  Her blood pressure is  controlled to target.  She is advised to continue Lisinopril 5 mg po daily and Bisoprolol 5 mg po daily.  3) Lipids/HPL:   Her recent lipid panel on 07/11/21 shows controlled LDL of 887 and elevated triglycerides of 214 (improving).  She is advised to continue Atorvastatin 40 mg po daily at bedtime.  Side effects and precautions discussed with her.    4) Weight management- Her Body mass index is 42.09 kg/m.Marland Kitchen  She is a candidate for modest weight loss and continues to work hard towards this.     5) Vitamin D Deficiency Her most recent vitamin D level from 01/11/22 was 17.7.  She is not on any vitamin D supplementation.  I discussed and initiated replenishment with Ergocalciferol 50000 units po weekly x 12 weeks.  6) Chronic Care/Health Maintenance: -she is on ACEI/ARB and Statin medications and is encouraged to continue to follow up with Ophthalmology, Dentist, Podiatrist at least yearly or according to recommendations, and advised to stay away from smoking. I have recommended yearly flu vaccine and pneumonia vaccination at least every 5 years; moderate intensity exercise for up to 150 minutes weekly; and  sleep for at least 7 hours a day.  - I advised patient to maintain close follow up with Theressa Stamps, MD for primary care needs.     I spent 40 minutes in the care of the patient today including review of labs from [place], Lipids, Thyroid Function, Hematology (current and previous including abstractions from other facilities); face-to-face time discussing  her blood glucose readings/logs, discussing hypoglycemia and hyperglycemia episodes and symptoms, medications doses, her options of short and long term treatment based on the latest standards of care / guidelines;  discussion about incorporating lifestyle medicine;  and documenting the encounter.    Please refer to Patient Instructions for Blood Glucose Monitoring and Insulin/Medications Dosing Guide"  in media tab for additional information. Please  also refer to " Patient Self Inventory" in the Media  tab for reviewed elements of pertinent patient  history.  Alyssa Fox participated in the discussions, expressed understanding, and voiced agreement with the above plans.  All questions were answered to her satisfaction. she is encouraged to contact clinic should she have any questions or concerns prior to her return visit.    Follow up plan: - Return in about 6 months (around 07/17/2022) for Diabetes F/U- A1c and UM in office, Previsit labs, Bring meter and logs.  Rayetta Pigg, [facility] [address] [place], [place] [address] Phone: [phone] Fax: [phone]  01/17/2022, 8:54 AM

## 2022-03-13 ENCOUNTER — Telehealth: Payer: Self-pay | Admitting: Nurse Practitioner

## 2022-03-13 ENCOUNTER — Other Ambulatory Visit: Payer: Self-pay

## 2022-03-13 MED ORDER — LANTUS SOLOSTAR 100 UNIT/ML ~~LOC~~ SOPN: PEN_INJECTOR | SUBCUTANEOUS | 1 refills | Status: DC

## 2022-03-13 NOTE — Telephone Encounter (Signed)
I have sent this prescription in to the requested pharmacy. ?

## 2022-03-13 NOTE — Telephone Encounter (Signed)
Patient left a Vm stating she needs a new prescription for her Lantus sent to [facility] ?

## 2022-03-14 ENCOUNTER — Other Ambulatory Visit: Payer: Self-pay | Admitting: "Endocrinology

## 2022-03-20 ENCOUNTER — Other Ambulatory Visit: Payer: Self-pay | Admitting: Nurse Practitioner

## 2022-03-21 NOTE — Telephone Encounter (Signed)
Last OV 01/17/2022 ? ?Next OV 07/17/2022 ? ?Do you want the patient to continue this medication? ?

## 2022-05-03 ENCOUNTER — Telehealth: Payer: Self-pay | Admitting: Nurse Practitioner

## 2022-05-03 MED ORDER — OZEMPIC (0.25 OR 0.5 MG/DOSE) 2 MG/1.5ML ~~LOC~~ SOPN
0.5000 mg | PEN_INJECTOR | SUBCUTANEOUS | 3 refills | Status: DC
Start: 2022-05-03 — End: 2022-07-04

## 2022-05-03 NOTE — Telephone Encounter (Signed)
I sent in refill for her to [facility]

## 2022-05-03 NOTE — Telephone Encounter (Signed)
Pt left a Vm stating that [facility] has been trying to get her a new RX for her Ozempic. She is on her last pen and is going out of town next week.

## 2022-05-27 ENCOUNTER — Other Ambulatory Visit: Payer: Self-pay | Admitting: Nurse Practitioner

## 2022-06-01 ENCOUNTER — Other Ambulatory Visit: Payer: Self-pay | Admitting: Nurse Practitioner

## 2022-06-01 DIAGNOSIS — E1122 Type 2 diabetes mellitus with diabetic chronic kidney disease: Secondary | ICD-10-CM

## 2022-07-04 ENCOUNTER — Other Ambulatory Visit: Payer: Self-pay | Admitting: Nurse Practitioner

## 2022-07-11 LAB — COMPREHENSIVE METABOLIC PANEL
ALT: 25 IU/L (ref 0–32)
AST: 23 IU/L (ref 0–40)
Albumin/Globulin Ratio: 1.7 (ref 1.2–2.2)
Albumin: 4.7 g/dL (ref 3.9–4.9)
Alkaline Phosphatase: 135 IU/L — ABNORMAL HIGH (ref 44–121)
BUN/Creatinine Ratio: 19 (ref 12–28)
BUN: 24 mg/dL (ref 8–27)
Bilirubin Total: 0.9 mg/dL (ref 0.0–1.2)
CO2: 22 mmol/L (ref 20–29)
Calcium: 10.4 mg/dL — ABNORMAL HIGH (ref 8.7–10.3)
Chloride: 101 mmol/L (ref 96–106)
Creatinine, Ser: 1.24 mg/dL — ABNORMAL HIGH (ref 0.57–1.00)
Globulin, Total: 2.8 g/dL (ref 1.5–4.5)
Glucose: 116 mg/dL — ABNORMAL HIGH (ref 70–99)
Potassium: 5.4 mmol/L — ABNORMAL HIGH (ref 3.5–5.2)
Sodium: 139 mmol/L (ref 134–144)
Total Protein: 7.5 g/dL (ref 6.0–8.5)
eGFR: 48 mL/min/{1.73_m2} — ABNORMAL LOW (ref >59)

## 2022-07-17 ENCOUNTER — Encounter: Payer: Self-pay | Admitting: Nurse Practitioner

## 2022-07-17 ENCOUNTER — Ambulatory Visit (INDEPENDENT_AMBULATORY_CARE_PROVIDER_SITE_OTHER): Payer: 59 | Admitting: Nurse Practitioner

## 2022-07-17 VITALS — BP 103/68 | HR 69 | Ht 64.0 in | Wt 230.0 lb

## 2022-07-17 DIAGNOSIS — E1122 Type 2 diabetes mellitus with diabetic chronic kidney disease: Secondary | ICD-10-CM

## 2022-07-17 DIAGNOSIS — I10 Essential (primary) hypertension: Secondary | ICD-10-CM | POA: Diagnosis not present

## 2022-07-17 DIAGNOSIS — E559 Vitamin D deficiency, unspecified: Secondary | ICD-10-CM | POA: Diagnosis not present

## 2022-07-17 DIAGNOSIS — N1831 Chronic kidney disease, stage 3a: Secondary | ICD-10-CM

## 2022-07-17 DIAGNOSIS — E782 Mixed hyperlipidemia: Secondary | ICD-10-CM | POA: Diagnosis not present

## 2022-07-17 DIAGNOSIS — Z794 Long term (current) use of insulin: Secondary | ICD-10-CM | POA: Diagnosis not present

## 2022-07-17 LAB — POCT GLYCOSYLATED HEMOGLOBIN (HGB A1C): HbA1c, POC (controlled diabetic range): 6.1 % (ref 0.0–7.0)

## 2022-07-17 LAB — POCT UA - MICROALBUMIN
Albumin/Creatinine Ratio, Urine, POC: <30
Creatinine, POC: 200 mg/dL
Microalbumin Ur, POC: 30 mg/L

## 2022-07-17 MED ORDER — SEMAGLUTIDE (1 MG/DOSE) 4 MG/3ML ~~LOC~~ SOPN: 1.0000 mg | PEN_INJECTOR | SUBCUTANEOUS | 3 refills | Status: DC

## 2022-07-17 MED ORDER — LANTUS SOLOSTAR 100 UNIT/ML ~~LOC~~ SOPN: 30.0000 [IU] | PEN_INJECTOR | Freq: Every day | SUBCUTANEOUS | 1 refills | Status: DC

## 2022-07-17 NOTE — Progress Notes (Signed)
07/17/2022, 10:01 AM                 Endocrinology follow-up note   Subjective:    [name], female    DOB: 1956-05-31.  [name] is being seen in follow-up  for management of currently uncontrolled symptomatic 2 diabetes, hyperlipidemia, hypertension, obesity. PMD:   Theressa Stamps, MD.   Past Medical History:  Diagnosis Date   DM type 2 causing vascular disease (Alyssa Fox)    Hypertension    MI (myocardial infarction) ([place])    Past Surgical History:  Procedure Laterality Date   CHOLECYSTECTOMY     CORONARY ANGIOPLASTY WITH STENT PLACEMENT     TUBAL LIGATION     Social History   Socioeconomic History   Marital status: Widowed    Spouse name: Not on file   Number of children: Not on file   Years of education: Not on file   Highest education level: Not on file  Occupational History   Not on file  Tobacco Use   Smoking status: Never   Smokeless tobacco: Never  Vaping Use   Vaping Use: Never used  Substance and Sexual Activity   Alcohol use: No   Drug use: No   Sexual activity: Not on file  Other Topics Concern   Not on file  Social History Narrative   Not on file   Social Determinants of Health   Financial Resource Strain: Not on file  Food Insecurity: Not on file  Transportation Needs: Not on file  Physical Activity: Not on file  Stress: Not on file  Social Connections: Not on file   Outpatient Encounter Medications as of 07/17/2022  Medication Sig   Semaglutide, 1 MG/DOSE, 4 MG/3ML SOPN Inject 1 mg as directed once a week.   allopurinol (ZYLOPRIM) 100 MG tablet every morning.   aspirin EC 81 MG tablet Take by mouth daily.   atorvastatin (LIPITOR) 40 MG tablet TAKE 1 TABLET BY MOUTH  DAILY   bisoprolol (ZEBETA) 5 MG tablet Take 5 mg by mouth daily.   clopidogrel (PLAVIX) 75 MG tablet 1 tablet daily.   gabapentin (NEURONTIN) 100 MG capsule TAKE 1 CAPSULE BY MOUTH 3 TIMES  DAILY   insulin  glargine (LANTUS SOLOSTAR) 100 UNIT/ML Solostar Pen Inject 30 Units into the skin at bedtime.   Insulin Pen Needle (PEN NEEDLES) 31G X 8 MM MISC Use as directed twice daily   lisinopril (ZESTRIL) 5 MG tablet TAKE 1 TABLET BY MOUTH  DAILY   nitroGLYCERIN (NITROSTAT) 0.4 MG SL tablet Place 0.4 mg under the tongue every 5 (five) minutes as needed for chest pain.   ONETOUCH ULTRA test strip 1 each 3 (three) times daily.   Vitamin D, Ergocalciferol, (DRISDOL) 1.25 MG (50000 UNIT) CAPS capsule TAKE 1 CAPSULE BY MOUTH EVERY 7  DAYS   [DISCONTINUED] LANTUS SOLOSTAR 100 UNIT/ML Solostar Pen INJECT SUBCUTANEOUSLY 45 UNITS  AT BEDTIME   [DISCONTINUED] OZEMPIC, 0.25 OR 0.5 MG/DOSE, 2 MG/3ML SOPN INJECT SUBCUTANEOUSLY 0.5 MG  EVERY WEEK   No facility-administered encounter medications on file as of 07/17/2022.    ALLERGIES: Allergies  Allergen Reactions   Latex Swelling   Tape Swelling    Other reaction(s): Unknown    VACCINATION STATUS:  There is no immunization history on file for this patient.  Diabetes She presents for her follow-up diabetic visit. She has type 2 diabetes mellitus.  Onset time: She was diagnosed at approximate age of 66 years. Her disease course has been stable. There are no hypoglycemic associated symptoms. Pertinent negatives for hypoglycemia include no confusion, headaches, pallor or seizures. Associated symptoms include weight loss. Pertinent negatives for diabetes include no chest pain, no polydipsia, no polyphagia and no polyuria. There are no hypoglycemic complications. Symptoms are stable. Diabetic complications include heart disease and peripheral neuropathy. (She is status post 3 stent placement for coronary artery disease and December 2015-about the time of her diagnosis with diabetes.) Risk factors for coronary artery disease include dyslipidemia, diabetes mellitus, family history, obesity, hypertension, post-menopausal and sedentary lifestyle. Current diabetic treatment  includes insulin injections (and Ozempic). She is compliant with treatment most of the time. Her weight is fluctuating minimally. She is following a generally healthy diet. When asked about meal planning, she reported none. She has not had a previous visit with a dietitian. She rarely participates in exercise. Her home blood glucose trend is fluctuating minimally. Her breakfast blood glucose range is generally 90-110 mg/dl. Her bedtime blood glucose range is generally 130-140 mg/dl. Her overall blood glucose range is 130-140 mg/dl. (She presents today with her CGM and meter showing at target fasting and postprandial glycemic profile.  Her POCT A1c today is 6.1%, essentially unchanged from previous visit.  Analysis of her CGM shows TIR 97%, TAR 2%, TBR < 2% with a GMI of 6.4%. ) An ACE inhibitor/angiotensin II receptor blocker is being taken. She does not see a podiatrist.Eye exam is current.  Hyperlipidemia This is a chronic problem. The current episode started more than 1 year ago. The problem is controlled. Recent lipid tests were reviewed and are variable. Exacerbating diseases include chronic renal disease, diabetes and obesity. Factors aggravating her hyperlipidemia include beta blockers and fatty foods. Pertinent negatives include no chest pain, myalgias or shortness of breath. Current antihyperlipidemic treatment includes statins. The current treatment provides moderate improvement of lipids. Compliance problems include adherence to diet and adherence to exercise.  Risk factors for coronary artery disease include hypertension, dyslipidemia, a sedentary lifestyle, post-menopausal, diabetes mellitus and obesity.  Hypertension This is a chronic problem. The current episode started more than 1 year ago. The problem has been gradually improving since onset. The problem is controlled. Pertinent negatives include no chest pain, headaches, palpitations or shortness of breath. There are no associated agents to  hypertension. Risk factors for coronary artery disease include dyslipidemia, diabetes mellitus, family history, post-menopausal state, obesity and sedentary lifestyle. Past treatments include ACE inhibitors and beta blockers. The current treatment provides moderate improvement. There are no compliance problems.  Hypertensive end-organ damage includes kidney disease. Identifiable causes of hypertension include chronic renal disease.    Review of systems  Constitutional: + steadily decreasing body weight,  current Body mass index is 39.48 kg/m. , no fatigue, no subjective hyperthermia, no subjective hypothermia Eyes: no blurry vision, no xerophthalmia ENT: no sore throat, no nodules palpated in throat, no dysphagia/odynophagia, no hoarseness Cardiovascular: no chest pain, no shortness of breath, no palpitations, no leg swelling Respiratory: no cough, no shortness of breath Gastrointestinal: no nausea/vomiting/diarrhea Musculoskeletal: no muscle/joint aches Skin: no rashes, no hyperemia Neurological: no tremors, no numbness, no tingling, no dizziness Psychiatric: no depression, no anxiety   Objective:    BP 103/68   Pulse 69   Ht 5' 4" (1.626 m)   Wt 230 lb (104.3 kg)   BMI 39.48 kg/m   Wt Readings from Last 3 Encounters:  07/17/22 230 lb (104.3 kg)  01/17/22 245 lb 3.2 oz (111.2 kg)  07/17/21 238 lb (108 kg)      BP Readings from Last 3 Encounters:  07/17/22 103/68  01/17/22 107/69  07/17/21 126/75     Physical Exam- Limited  Constitutional:  Body mass index is 39.48 kg/m. , not in acute distress, normal state of mind Eyes:  EOMI, no exophthalmos Neck: Supple Cardiovascular: RRR, no murmurs, rubs, or gallops, no edema Respiratory: Adequate breathing efforts, no crackles, rales, rhonchi, or wheezing Musculoskeletal: no gross deformities, strength intact in all four extremities, no gross restriction of joint movements Skin:  no rashes, no hyperemia Neurological: no tremor  with outstretched hands    Recent Results (from the past 2160 hour(s))  Comprehensive metabolic panel     Status: Abnormal   Collection Time: 07/10/22  8:10 AM  Result Value Ref Range   Glucose 116 (H) 70 - 99 mg/dL   BUN 24 8 - 27 mg/dL   Creatinine, Ser 1.24 (H) 0.57 - 1.00 mg/dL   eGFR 48 (L) >59 mL/min/1.73   BUN/Creatinine Ratio 19 12 - 28   Sodium 139 134 - 144 mmol/L   Potassium 5.4 (H) 3.5 - 5.2 mmol/L   Chloride 101 96 - 106 mmol/L   CO2 22 20 - 29 mmol/L   Calcium 10.4 (H) 8.7 - 10.3 mg/dL   Total Protein 7.5 6.0 - 8.5 g/dL   Albumin 4.7 3.9 - 4.9 g/dL   Globulin, Total 2.8 1.5 - 4.5 g/dL   Albumin/Globulin Ratio 1.7 1.2 - 2.2   Bilirubin Total 0.9 0.0 - 1.2 mg/dL   Alkaline Phosphatase 135 (H) 44 - 121 IU/L   AST 23 0 - 40 IU/L   ALT 25 0 - 32 IU/L  HgB A1c     Status: Normal   Collection Time: 07/17/22  9:10 AM  Result Value Ref Range   Hemoglobin A1C     HbA1c POC (<> result, manual entry)     HbA1c, POC (prediabetic range)     HbA1c, POC (controlled diabetic range) 6.1 0.0 - 7.0 %  POCT UA - Microalbumin     Status: Normal   Collection Time: 07/17/22  9:10 AM  Result Value Ref Range   Microalbumin Ur, POC 30 mg/L   Creatinine, POC 200 mg/dL   Albumin/Creatinine Ratio, Urine, POC <30    Lipid Panel     Component Value Date/Time   CHOL 153 07/11/2021 0912   TRIG 214 (H) 07/11/2021 0912   HDL 30 (L) 07/11/2021 0912   CHOLHDL 5.1 (H) 07/11/2021 0912   CHOLHDL 4.8 04/06/2020 1132   LDLCALC 87 07/11/2021 0912   LDLCALC 86 04/06/2020 1132   LABVLDL 36 07/11/2021 0912      Assessment & Plan:   1) Type 2 diabetes mellitus with stage 3 chronic kidney disease, with long-term current use of insulin (HCC)  - MARIYAH UPSHAW has currently uncontrolled symptomatic type 2 DM since 66 years of age.  She presents today with her CGM and meter showing at target fasting and postprandial glycemic profile.  Her POCT A1c today is 6.1%, essentially unchanged from  previous visit.  Analysis of her CGM shows TIR 97%, TAR 2%, TBR < 2% with a GMI of 6.4%.  - Recent labs reviewed, stable stage 3 renal insufficiency.  -her diabetes is complicated by coronary artery disease which required stent placement in 2015, renal insufficiency, obesity/sedentary life and YUVONNE LANAHAN remains at a high risk for more acute and chronic  complications which include CAD, CVA, CKD, retinopathy, and neuropathy. These are all discussed in detail with the patient.  - Nutritional counseling repeated at each appointment due to patients tendency to fall back in to old habits.  - The patient admits there is a room for improvement in their diet and drink choices. -  Suggestion is made for the patient to avoid simple carbohydrates from their diet including Cakes, Sweet Desserts / Pastries, Ice Cream, Soda (diet and regular), Sweet Tea, Candies, Chips, Cookies, Sweet Pastries, Store Bought Juices, Alcohol in Excess of 1-2 drinks a day, Artificial Sweeteners, Coffee Creamer, and "Sugar-free" Products. This will help patient to have stable blood glucose profile and potentially avoid unintended weight gain.   - I encouraged the patient to switch to unprocessed or minimally processed complex starch and increased protein intake (animal or plant source), fruits, and vegetables.   - Patient is advised to stick to a routine mealtimes to eat 3 meals a day and avoid unnecessary snacks (to snack only to correct hypoglycemia).  - I have approached her with the following individualized plan to manage diabetes and patient agrees:   -She is advised to lower her Lanuts to 30 units SQ nightly and will increase her Ozempic to 1 mg SQ weekly (may increase further depending on results).    -She is encouraged to monitor blood glucose at least twice daily(using her CGM provided for her by her employer), before breakfast and before bed and report readings less than 70 or greater than 200 for 3 tests in a  row.  She says her insurance is changing her from [place] to [place] soon.  - Patient is warned not to take insulin without proper monitoring per orders.  2) BP/HTN:  Her blood pressure is controlled to target.  She is advised to continue Lisinopril 5 mg po daily and Bisoprolol 5 mg po daily.  3) Lipids/HPL:  Her recent lipid panel on 07/11/21 shows controlled LDL of 87 and elevated triglycerides of 214 (improving).  She is advised to continue Atorvastatin 40 mg po daily at bedtime.  Side effects and precautions discussed with her.  Will recheck lipid panel prior to next visit.  4) Weight management- Her Body mass index is 39.48 kg/m.Marland Kitchen  She is a candidate for modest weight loss and continues to work hard towards this.     5) Vitamin D Deficiency Her most recent vitamin D level from 01/11/22 was 17.7.  She is not on any vitamin D supplementation. She has completed Ergocalciferol replenishment.  Will recheck vitamin D prior to next visit.  6) Chronic Care/Health Maintenance: -she is on ACEI/ARB and Statin medications and is encouraged to continue to follow up with Ophthalmology, Dentist, Podiatrist at least yearly or according to recommendations, and advised to stay away from smoking. I have recommended yearly flu vaccine and pneumonia vaccination at least every 5 years; moderate intensity exercise for up to 150 minutes weekly; and  sleep for at least 7 hours a day.  - I advised patient to maintain close follow up with Theressa Stamps, MD for primary care needs.      I spent 48 minutes in the care of the patient today including review of labs from [place], Lipids, Thyroid Function, Hematology (current and previous including abstractions from other facilities); face-to-face time discussing  her blood glucose readings/logs, discussing hypoglycemia and hyperglycemia episodes and symptoms, medications doses, her options of short and long term treatment based on the latest standards of care / guidelines;  discussion about incorporating lifestyle medicine;  and documenting the encounter. Risk reduction counseling performed per USPSTF guidelines to reduce obesity and cardiovascular risk factors.     Please refer to Patient Instructions for Blood Glucose Monitoring and Insulin/Medications Dosing Guide"  in media tab for additional information. Please  also refer to " Patient Self Inventory" in the Media  tab for reviewed elements of pertinent patient history.  Virgel Bouquet participated in the discussions, expressed understanding, and voiced agreement with the above plans.  All questions were answered to her satisfaction. she is encouraged to contact clinic should she have any questions or concerns prior to her return visit.    Follow up plan: - Return in about 6 months (around 01/17/2023) for Diabetes F/U with A1c in office, Previsit labs, Bring meter and logs.  Rayetta Pigg, [facility] [address] [place], [place] [address] Phone: [phone] Fax: [phone]  07/17/2022, 10:01 AM

## 2022-07-23 ENCOUNTER — Other Ambulatory Visit: Payer: Self-pay | Admitting: Nurse Practitioner

## 2022-08-07 ENCOUNTER — Telehealth: Payer: Self-pay | Admitting: Nurse Practitioner

## 2022-08-07 NOTE — Telephone Encounter (Signed)
Pt left a VM stating she has her dexcom on her arm and needs to get connected with office. Please call pt

## 2022-08-07 NOTE — Telephone Encounter (Signed)
Lft VM for pt that I sent an invite through Dexcom [employer] to the email we had on file [email or web address] with instructions

## 2022-08-20 ENCOUNTER — Other Ambulatory Visit: Payer: Self-pay | Admitting: Nurse Practitioner

## 2022-10-27 ENCOUNTER — Other Ambulatory Visit: Payer: Self-pay | Admitting: Nurse Practitioner

## 2022-11-12 ENCOUNTER — Other Ambulatory Visit: Payer: Self-pay | Admitting: Nurse Practitioner

## 2022-11-16 ENCOUNTER — Other Ambulatory Visit: Payer: Self-pay | Admitting: Nurse Practitioner

## 2022-11-30 ENCOUNTER — Other Ambulatory Visit: Payer: Self-pay | Admitting: Nurse Practitioner

## 2022-11-30 DIAGNOSIS — E1122 Type 2 diabetes mellitus with diabetic chronic kidney disease: Secondary | ICD-10-CM

## 2023-01-10 LAB — LIPID PANEL
Chol/HDL Ratio: 3.7 ratio (ref 0.0–4.4)
Cholesterol, Total: 131 mg/dL (ref 100–199)
HDL: 35 mg/dL — ABNORMAL LOW (ref >39)
LDL Chol Calc (NIH): 69 mg/dL (ref 0–99)
Triglycerides: 153 mg/dL — ABNORMAL HIGH (ref 0–149)
VLDL Cholesterol Cal: 27 mg/dL (ref 5–40)

## 2023-01-10 LAB — COMPREHENSIVE METABOLIC PANEL
ALT: 25 IU/L (ref 0–32)
AST: 19 IU/L (ref 0–40)
Albumin/Globulin Ratio: 1.7 (ref 1.2–2.2)
Albumin: 4.5 g/dL (ref 3.9–4.9)
Alkaline Phosphatase: 134 IU/L — ABNORMAL HIGH (ref 44–121)
BUN/Creatinine Ratio: 19 (ref 12–28)
BUN: 20 mg/dL (ref 8–27)
Bilirubin Total: 0.8 mg/dL (ref 0.0–1.2)
CO2: 23 mmol/L (ref 20–29)
Calcium: 10 mg/dL (ref 8.7–10.3)
Chloride: 103 mmol/L (ref 96–106)
Creatinine, Ser: 1.07 mg/dL — ABNORMAL HIGH (ref 0.57–1.00)
Globulin, Total: 2.6 g/dL (ref 1.5–4.5)
Glucose: 90 mg/dL (ref 70–99)
Potassium: 5.1 mmol/L (ref 3.5–5.2)
Sodium: 141 mmol/L (ref 134–144)
Total Protein: 7.1 g/dL (ref 6.0–8.5)
eGFR: 57 mL/min/{1.73_m2} — ABNORMAL LOW (ref >59)

## 2023-01-10 LAB — VITAMIN D 25 HYDROXY (VIT D DEFICIENCY, FRACTURES): Vit D, 25-Hydroxy: 73.5 ng/mL (ref 30.0–100.0)

## 2023-01-10 LAB — TSH: TSH: 1.13 u[IU]/mL (ref 0.450–4.500)

## 2023-01-10 LAB — T4, FREE: Free T4: 1.22 ng/dL (ref 0.82–1.77)

## 2023-01-17 ENCOUNTER — Other Ambulatory Visit: Payer: Self-pay | Admitting: Nurse Practitioner

## 2023-01-17 ENCOUNTER — Ambulatory Visit (INDEPENDENT_AMBULATORY_CARE_PROVIDER_SITE_OTHER): Payer: 59 | Admitting: Nurse Practitioner

## 2023-01-17 ENCOUNTER — Encounter: Payer: Self-pay | Admitting: Nurse Practitioner

## 2023-01-17 VITALS — BP 95/62 | HR 73 | Ht 65.0 in | Wt 234.4 lb

## 2023-01-17 DIAGNOSIS — I10 Essential (primary) hypertension: Secondary | ICD-10-CM | POA: Diagnosis not present

## 2023-01-17 DIAGNOSIS — E782 Mixed hyperlipidemia: Secondary | ICD-10-CM

## 2023-01-17 DIAGNOSIS — E1122 Type 2 diabetes mellitus with diabetic chronic kidney disease: Secondary | ICD-10-CM

## 2023-01-17 DIAGNOSIS — E559 Vitamin D deficiency, unspecified: Secondary | ICD-10-CM | POA: Diagnosis not present

## 2023-01-17 DIAGNOSIS — Z794 Long term (current) use of insulin: Secondary | ICD-10-CM | POA: Diagnosis not present

## 2023-01-17 DIAGNOSIS — N1831 Chronic kidney disease, stage 3a: Secondary | ICD-10-CM | POA: Diagnosis not present

## 2023-01-17 MED ORDER — SEMAGLUTIDE (2 MG/DOSE) 8 MG/3ML ~~LOC~~ SOPN: 2.0000 mg | PEN_INJECTOR | SUBCUTANEOUS | 3 refills | Status: DC

## 2023-01-17 NOTE — Progress Notes (Signed)
01/17/2023, 9:43 AM                 Endocrinology follow-up note   Subjective:    [name], female    DOB: 27-Jan-1956.  Alyssa Fox is being seen in follow-up  for management of currently uncontrolled symptomatic 2 diabetes, hyperlipidemia, hypertension, obesity. PMD:   Theressa Stamps, MD.   Past Medical History:  Diagnosis Date   DM type 2 causing vascular disease ([place])    Hypertension    MI (myocardial infarction) ([place])    Past Surgical History:  Procedure Laterality Date   CHOLECYSTECTOMY     CORONARY ANGIOPLASTY WITH STENT PLACEMENT     TUBAL LIGATION     Social History   Socioeconomic History   Marital status: Widowed    Spouse name: Not on file   Number of children: Not on file   Years of education: Not on file   Highest education level: Not on file  Occupational History   Not on file  Tobacco Use   Smoking status: Never   Smokeless tobacco: Never  Vaping Use   Vaping Use: Never used  Substance and Sexual Activity   Alcohol use: No   Drug use: No   Sexual activity: Not on file  Other Topics Concern   Not on file  Social History Narrative   Not on file   Social Determinants of Health   Financial Resource Strain: Not on file  Food Insecurity: Not on file  Transportation Needs: Not on file  Physical Activity: Not on file  Stress: Not on file  Social Connections: Not on file   Outpatient Encounter Medications as of 01/17/2023  Medication Sig   allopurinol (ZYLOPRIM) 100 MG tablet every morning.   aspirin EC 81 MG tablet Take by mouth daily.   atorvastatin (LIPITOR) 40 MG tablet TAKE 1 TABLET BY MOUTH  DAILY   bisoprolol (ZEBETA) 5 MG tablet Take 5 mg by mouth daily.   clopidogrel (PLAVIX) 75 MG tablet 1 tablet daily.   gabapentin (NEURONTIN) 100 MG capsule Take 1 capsule (100 mg total) by mouth 3 (three) times daily.   Insulin Pen Needle (PEN NEEDLES) 31G X 8 MM MISC Use as directed  twice daily   lisinopril (ZESTRIL) 5 MG tablet TAKE 1 TABLET BY MOUTH  DAILY   nitroGLYCERIN (NITROSTAT) 0.4 MG SL tablet Place 0.4 mg under the tongue every 5 (five) minutes as needed for chest pain.   ONETOUCH ULTRA test strip 1 each 3 (three) times daily.   Semaglutide, 2 MG/DOSE, 8 MG/3ML SOPN Inject 2 mg as directed once a week.   Vitamin D, Ergocalciferol, (DRISDOL) 1.25 MG (50000 UNIT) CAPS capsule TAKE 1 CAPSULE BY MOUTH EVERY 7  DAYS   [DISCONTINUED] insulin glargine (LANTUS SOLOSTAR) 100 UNIT/ML Solostar Pen INJECT SUBCUTANEOUSLY 30 UNITS  AT BEDTIME   [DISCONTINUED] Semaglutide, 1 MG/DOSE, (OZEMPIC, 1 MG/DOSE,) 4 MG/3ML SOPN INJECT SUBCUTANEOUSLY 1 MG EVERY WEEK   No facility-administered encounter medications on file as of 01/17/2023.    ALLERGIES: Allergies  Allergen Reactions   Latex Swelling   Tape Swelling    Other reaction(s): Unknown    VACCINATION STATUS:  There is no immunization history on file for this patient.  Diabetes She presents for her follow-up diabetic visit. She has type 2 diabetes mellitus. Onset time: She was diagnosed at approximate age of 67 years. Her disease  course has been stable. There are no hypoglycemic associated symptoms. Pertinent negatives for hypoglycemia include no confusion, headaches, pallor or seizures. Pertinent negatives for diabetes include no chest pain, no polydipsia, no polyphagia, no polyuria and no weight loss. There are no hypoglycemic complications. Symptoms are stable. Diabetic complications include heart disease and peripheral neuropathy. (She is status post 3 stent placement for coronary artery disease and December 2015-about the time of her diagnosis with diabetes.) Risk factors for coronary artery disease include dyslipidemia, diabetes mellitus, family history, obesity, hypertension, post-menopausal and sedentary lifestyle. Current diabetic treatment includes insulin injections (and Ozempic). She is compliant with treatment most  of the time. Her weight is fluctuating minimally. She is following a generally healthy diet. When asked about meal planning, she reported none. She has not had a previous visit with a dietitian. She rarely participates in exercise. Her home blood glucose trend is decreasing steadily. Her overall blood glucose range is 110-130 mg/dl. (She presents today with her CGM showing at goal glycemic profile overall with tight fasting readings.  Her POCT A1c today is 6.2%, essentially unchanged from previous visit.  She notes she has had some nocturnal hypoglycemia.  Analysis of her CGM shows TIR 99%, TAR 1%, TBR 0% with a GMI of 6%.) An ACE inhibitor/angiotensin II receptor blocker is being taken. She does not see a podiatrist.Eye exam is current.  Hyperlipidemia This is a chronic problem. The current episode started more than 1 year ago. The problem is controlled. Recent lipid tests were reviewed and are variable. Exacerbating diseases include chronic renal disease, diabetes and obesity. Factors aggravating her hyperlipidemia include beta blockers and fatty foods. Pertinent negatives include no chest pain, myalgias or shortness of breath. Current antihyperlipidemic treatment includes statins. The current treatment provides moderate improvement of lipids. Compliance problems include adherence to diet and adherence to exercise.  Risk factors for coronary artery disease include hypertension, dyslipidemia, a sedentary lifestyle, post-menopausal, diabetes mellitus and obesity.  Hypertension This is a chronic problem. The current episode started more than 1 year ago. The problem has been gradually improving since onset. The problem is controlled. Pertinent negatives include no chest pain, headaches, palpitations or shortness of breath. There are no associated agents to hypertension. Risk factors for coronary artery disease include dyslipidemia, diabetes mellitus, family history, post-menopausal state, obesity and sedentary  lifestyle. Past treatments include ACE inhibitors and beta blockers. The current treatment provides moderate improvement. There are no compliance problems.  Hypertensive end-organ damage includes kidney disease. Identifiable causes of hypertension include chronic renal disease.    Review of systems  Constitutional: + stable body weight,  current Body mass index is 39.01 kg/m. , no fatigue, no subjective hyperthermia, no subjective hypothermia Eyes: no blurry vision, no xerophthalmia ENT: no sore throat, no nodules palpated in throat, no dysphagia/odynophagia, no hoarseness Cardiovascular: no chest pain, no shortness of breath, no palpitations, no leg swelling Respiratory: no cough, no shortness of breath Gastrointestinal: no nausea/vomiting/diarrhea Musculoskeletal: no muscle/joint aches Skin: no rashes, no hyperemia Neurological: no tremors, no numbness, no tingling, no dizziness Psychiatric: no depression, no anxiety   Objective:    BP 95/62 (BP Location: Right Arm, Patient Position: Sitting, Cuff Size: Large)   Pulse 73   Ht 5' 5"$  (1.651 m)   Wt 234 lb 6.4 oz (106.3 kg)   BMI 39.01 kg/m   Wt Readings from Last 3 Encounters:  01/17/23 234 lb 6.4 oz (106.3 kg)  07/17/22 230 lb (104.3 kg)  01/17/22 245 lb 3.2 oz (111.2  kg)      BP Readings from Last 3 Encounters:  01/17/23 95/62  07/17/22 103/68  01/17/22 107/69     Physical Exam- Limited  Constitutional:  Body mass index is 39.01 kg/m. , not in acute distress, normal state of mind Eyes:  EOMI, no exophthalmos Musculoskeletal: no gross deformities, strength intact in all four extremities, no gross restriction of joint movements Skin:  no rashes, no hyperemia Neurological: no tremor with outstretched hands    Recent Results (from the past 2160 hour(s))  Comprehensive metabolic panel     Status: Abnormal   Collection Time: 01/09/23  9:35 AM  Result Value Ref Range   Glucose 90 70 - 99 mg/dL   BUN 20 8 - 27 mg/dL    Creatinine, Ser 1.07 (H) 0.57 - 1.00 mg/dL   eGFR 57 (L) >59 mL/min/1.73   BUN/Creatinine Ratio 19 12 - 28   Sodium 141 134 - 144 mmol/L   Potassium 5.1 3.5 - 5.2 mmol/L   Chloride 103 96 - 106 mmol/L   CO2 23 20 - 29 mmol/L   Calcium 10.0 8.7 - 10.3 mg/dL   Total Protein 7.1 6.0 - 8.5 g/dL   Albumin 4.5 3.9 - 4.9 g/dL   Globulin, Total 2.6 1.5 - 4.5 g/dL   Albumin/Globulin Ratio 1.7 1.2 - 2.2   Bilirubin Total 0.8 0.0 - 1.2 mg/dL   Alkaline Phosphatase 134 (H) 44 - 121 IU/L   AST 19 0 - 40 IU/L   ALT 25 0 - 32 IU/L  Lipid panel     Status: Abnormal   Collection Time: 01/09/23  9:35 AM  Result Value Ref Range   Cholesterol, Total 131 100 - 199 mg/dL   Triglycerides 153 (H) 0 - 149 mg/dL   HDL 35 (L) >39 mg/dL   VLDL Cholesterol Cal 27 5 - 40 mg/dL   LDL Chol Calc (NIH) 69 0 - 99 mg/dL   Chol/HDL Ratio 3.7 0.0 - 4.4 ratio    Comment:                                   T. Chol/HDL Ratio                                             Men  Women                               1/2 Avg.Risk  3.4    3.3                                   Avg.Risk  5.0    4.4                                2X Avg.Risk  9.6    7.1                                3X Avg.Risk 23.4   11.0   TSH     Status: None   Collection Time: 01/09/23  9:35 AM  Result Value Ref Range   TSH 1.130 0.450 - 4.500 uIU/mL  T4, free     Status: None   Collection Time: 01/09/23  9:35 AM  Result Value Ref Range   Free T4 1.22 0.82 - 1.77 ng/dL  VITAMIN D 25 Hydroxy (Vit-D Deficiency, Fractures)     Status: None   Collection Time: 01/09/23  9:35 AM  Result Value Ref Range   Vit D, 25-Hydroxy 73.5 30.0 - 100.0 ng/mL    Comment: Vitamin D deficiency has been defined by the [place] practice guideline as a level of serum 25-OH vitamin D less than 20 ng/mL (1,2). The Endocrine Society went on to further define vitamin D insufficiency as a level between 21 and 29 ng/mL (2). 1. IOM  (Institute of Medicine). 2010. Dietary reference    intakes for calcium and D. [place]: The    [employer]. 2. Holick MF, Binkley Comfort, Bischoff-Ferrari HA, et al.    Evaluation, treatment, and prevention of vitamin D    deficiency: an Endocrine Society clinical practice    guideline. JCEM. 2011 Jul; 96(7):1911-30.    Lipid Panel     Component Value Date/Time   CHOL 131 01/09/2023 0935   TRIG 153 (H) 01/09/2023 0935   HDL 35 (L) 01/09/2023 0935   CHOLHDL 3.7 01/09/2023 0935   CHOLHDL 4.8 04/06/2020 1132   LDLCALC 69 01/09/2023 0935   LDLCALC 86 04/06/2020 1132   LABVLDL 27 01/09/2023 0935      Assessment & Plan:   1) Type 2 diabetes mellitus with stage 3 chronic kidney disease, with long-term current use of insulin ([facility])  - ERICCA ZUHLKE has currently uncontrolled symptomatic type 2 DM since 67 years of age.  She presents today with her CGM showing at goal glycemic profile overall with tight fasting readings.  Her POCT A1c today is 6.2%, essentially unchanged from previous visit.  She notes she has had some nocturnal hypoglycemia.  Analysis of her CGM shows TIR 99%, TAR 1%, TBR 0% with a GMI of 6%.  - Recent labs reviewed, stable stage 3 renal insufficiency.  -her diabetes is complicated by coronary artery disease which required stent placement in 2015, renal insufficiency, obesity/sedentary life and Alyssa Fox remains at a high risk for more acute and chronic complications which include CAD, CVA, CKD, retinopathy, and neuropathy. These are all discussed in detail with the patient.  - Nutritional counseling repeated at each appointment due to patients tendency to fall back in to old habits.  - The patient admits there is a room for improvement in their diet and drink choices. -  Suggestion is made for the patient to avoid simple carbohydrates from their diet including Cakes, Sweet Desserts / Pastries, Ice Cream, Soda (diet and regular), Sweet Tea,  Candies, Chips, Cookies, Sweet Pastries, Store Bought Juices, Alcohol in Excess of 1-2 drinks a day, Artificial Sweeteners, Coffee Creamer, and "Sugar-free" Products. This will help patient to have stable blood glucose profile and potentially avoid unintended weight gain.   - I encouraged the patient to switch to unprocessed or minimally processed complex starch and increased protein intake (animal or plant source), fruits, and vegetables.   - Patient is advised to stick to a routine mealtimes to eat 3 meals a day and avoid unnecessary snacks (to snack only to correct hypoglycemia).  - I have approached her with the following individualized plan to manage diabetes and patient agrees:   -Given her  fasting hypoglycemia, she is advised to stop her Lantus and increase her Ozempic to 2 mg SQ weekly.   -She is encouraged to monitor blood glucose at least twice daily(using her CGM provided for her by her employer), before breakfast and before bed and report readings less than 70 or greater than 200 for 3 tests in a row.    - Patient is warned not to take insulin without proper monitoring per orders.  2) BP/HTN:  Her blood pressure is controlled to target.  She is advised to continue Lisinopril 5 mg po daily and Bisoprolol 5 mg po daily.  3) Lipids/HPL:  Her recent lipid panel on 01/09/23 shows controlled LDL of 69 and elevated triglycerides of 153 (improving).  She is advised to continue Atorvastatin 40 mg po daily at bedtime.  Side effects and precautions discussed with her.    4) Weight management- Her Body mass index is 39.01 kg/m.Marland Kitchen  She is a candidate for modest weight loss and continues to work hard towards this.     5) Vitamin D Deficiency Her most recent vitamin D level from 01/09/23 was 73.5.  She continues on vitamin D supplementation.   6) Chronic Care/Health Maintenance: -she is on ACEI/ARB and Statin medications and is encouraged to continue to follow up with Ophthalmology, Dentist,  Podiatrist at least yearly or according to recommendations, and advised to stay away from smoking. I have recommended yearly flu vaccine and pneumonia vaccination at least every 5 years; moderate intensity exercise for up to 150 minutes weekly; and  sleep for at least 7 hours a day.  - I advised patient to maintain close follow up with Theressa Stamps, MD for primary care needs.      I spent  30  minutes in the care of the patient today including review of labs from [facility], Lipids, Thyroid Function, Hematology (current and previous including abstractions from other facilities); face-to-face time discussing  her blood glucose readings/logs, discussing hypoglycemia and hyperglycemia episodes and symptoms, medications doses, her options of short and long term treatment based on the latest standards of care / guidelines;  discussion about incorporating lifestyle medicine;  and documenting the encounter. Risk reduction counseling performed per USPSTF guidelines to reduce obesity and cardiovascular risk factors.     Please refer to Patient Instructions for Blood Glucose Monitoring and Insulin/Medications Dosing Guide"  in media tab for additional information. Please  also refer to " Patient Self Inventory" in the Media  tab for reviewed elements of pertinent patient history.  Virgel Bouquet participated in the discussions, expressed understanding, and voiced agreement with the above plans.  All questions were answered to her satisfaction. she is encouraged to contact clinic should she have any questions or concerns prior to her return visit.    Follow up plan: - Return in about 6 months (around 07/18/2023) for Diabetes F/U with A1c in office, No previsit labs, Bring meter and logs.  Rayetta Pigg, [facility] [address] [place], [place] [address] Phone: [phone] Fax: [phone]  01/17/2023, 9:43 AM

## 2023-01-26 ENCOUNTER — Other Ambulatory Visit: Payer: Self-pay | Admitting: Nurse Practitioner

## 2023-01-29 NOTE — Telephone Encounter (Signed)
That is correct.  She was to stop the insulin due to hypoglycemia and increase the Ozempic.

## 2023-05-25 ENCOUNTER — Other Ambulatory Visit: Payer: Self-pay | Admitting: Nurse Practitioner

## 2023-07-22 ENCOUNTER — Ambulatory Visit (INDEPENDENT_AMBULATORY_CARE_PROVIDER_SITE_OTHER): Payer: 59 | Admitting: Nurse Practitioner

## 2023-07-22 ENCOUNTER — Encounter: Payer: Self-pay | Admitting: Nurse Practitioner

## 2023-07-22 VITALS — BP 101/66 | Ht 65.0 in | Wt 226.0 lb

## 2023-07-22 DIAGNOSIS — E782 Mixed hyperlipidemia: Secondary | ICD-10-CM | POA: Diagnosis not present

## 2023-07-22 DIAGNOSIS — N1831 Chronic kidney disease, stage 3a: Secondary | ICD-10-CM

## 2023-07-22 DIAGNOSIS — E1122 Type 2 diabetes mellitus with diabetic chronic kidney disease: Secondary | ICD-10-CM

## 2023-07-22 DIAGNOSIS — Z7985 Long-term (current) use of injectable non-insulin antidiabetic drugs: Secondary | ICD-10-CM

## 2023-07-22 DIAGNOSIS — Z794 Long term (current) use of insulin: Secondary | ICD-10-CM

## 2023-07-22 DIAGNOSIS — E559 Vitamin D deficiency, unspecified: Secondary | ICD-10-CM

## 2023-07-22 DIAGNOSIS — I10 Essential (primary) hypertension: Secondary | ICD-10-CM | POA: Diagnosis not present

## 2023-07-22 LAB — POCT GLYCOSYLATED HEMOGLOBIN (HGB A1C): Hemoglobin A1C: 7 % — AB (ref 4.0–5.6)

## 2023-07-22 MED ORDER — OZEMPIC (2 MG/DOSE) 8 MG/3ML ~~LOC~~ SOPN: 2.0000 mg | PEN_INJECTOR | SUBCUTANEOUS | 0 refills | Status: DC

## 2023-07-22 NOTE — Progress Notes (Signed)
07/22/2023, 8:42 AM                 Endocrinology follow-up note   Subjective:    [name], female    DOB: 1956/02/02.  Alyssa Fox is being seen in follow-up  for management of currently uncontrolled symptomatic 2 diabetes, hyperlipidemia, hypertension, obesity. PMD:   Jonetta Speak, MD.   Past Medical History:  Diagnosis Date   DM type 2 causing vascular disease (HCC)    Hypertension    MI (myocardial infarction) (HCC)    Past Surgical History:  Procedure Laterality Date   CHOLECYSTECTOMY     CORONARY ANGIOPLASTY WITH STENT PLACEMENT     TUBAL LIGATION     Social History   Socioeconomic History   Marital status: Widowed    Spouse name: Not on file   Number of children: Not on file   Years of education: Not on file   Highest education level: Not on file  Occupational History   Not on file  Tobacco Use   Smoking status: Never   Smokeless tobacco: Never  Vaping Use   Vaping status: Never Used  Substance and Sexual Activity   Alcohol use: No   Drug use: No   Sexual activity: Not on file  Other Topics Concern   Not on file  Social History Narrative   Not on file   Social Determinants of Health   Financial Resource Strain: Not on file  Food Insecurity: Not on file  Transportation Needs: Not on file  Physical Activity: Not on file  Stress: Not on file  Social Connections: Not on file   Outpatient Encounter Medications as of 07/22/2023  Medication Sig   allopurinol (ZYLOPRIM) 100 MG tablet every morning.   aspirin EC 81 MG tablet Take by mouth daily.   atorvastatin (LIPITOR) 40 MG tablet TAKE 1 TABLET BY MOUTH  DAILY   bisoprolol (ZEBETA) 5 MG tablet Take 5 mg by mouth daily.   clopidogrel (PLAVIX) 75 MG tablet 1 tablet daily.   gabapentin (NEURONTIN) 100 MG capsule Take 1 capsule (100 mg total) by mouth 3 (three) times daily.   Insulin Pen Needle (PEN NEEDLES) 31G X 8 MM MISC Use as directed  twice daily   lisinopril (ZESTRIL) 5 MG tablet TAKE 1 TABLET BY MOUTH  DAILY   nitroGLYCERIN (NITROSTAT) 0.4 MG SL tablet Place 0.4 mg under the tongue every 5 (five) minutes as needed for chest pain.   ONETOUCH ULTRA test strip 1 each 3 (three) times daily.   Vitamin D, Ergocalciferol, (DRISDOL) 1.25 MG (50000 UNIT) CAPS capsule TAKE 1 CAPSULE BY MOUTH EVERY 7  DAYS   [DISCONTINUED] Semaglutide, 2 MG/DOSE, (OZEMPIC, 2 MG/DOSE,) 8 MG/3ML SOPN INJECT SUBCUTANEOUSLY 2 MG EVERY WEEK   Semaglutide, 2 MG/DOSE, (OZEMPIC, 2 MG/DOSE,) 8 MG/3ML SOPN Inject 2 mg into the skin once a week.   No facility-administered encounter medications on file as of 07/22/2023.    ALLERGIES: Allergies  Allergen Reactions   Latex Swelling   Tape Swelling    Other reaction(s): Unknown    VACCINATION STATUS:  There is no immunization history on file for this patient.  Diabetes She presents for her follow-up diabetic visit. She has type 2 diabetes mellitus. Onset time: She was diagnosed at approximate age of 5 years. Her disease course has been stable. There are no hypoglycemic associated symptoms. Pertinent negatives for hypoglycemia  include no confusion, headaches, pallor or seizures. Pertinent negatives for diabetes include no chest pain, no polydipsia, no polyphagia, no polyuria and no weight loss. There are no hypoglycemic complications. Symptoms are stable. Diabetic complications include heart disease and peripheral neuropathy. (She is status post 3 stent placement for coronary artery disease and December 2015-about the time of her diagnosis with diabetes.) Risk factors for coronary artery disease include dyslipidemia, diabetes mellitus, family history, obesity, hypertension, post-menopausal and sedentary lifestyle. Current diabetic treatment includes insulin injections (and Ozempic). She is compliant with treatment most of the time. Her weight is fluctuating minimally. She is following a generally healthy diet.  When asked about meal planning, she reported none. She has not had a previous visit with a dietitian. She rarely participates in exercise. Her home blood glucose trend is fluctuating minimally. Her overall blood glucose range is 140-180 mg/dl. (She presents today with her CGM showing at goal glycemic profile overall.  Her POCT A1c today is 7%, increasing from previous visit of 6.2%.  Analysis of her CGM shows TIR 86%, TAR 14%, TBR 0% with a GMI of 6.9%.) An ACE inhibitor/angiotensin II receptor blocker is being taken. She does not see a podiatrist.Eye exam is current.  Hyperlipidemia This is a chronic problem. The current episode started more than 1 year ago. The problem is controlled. Recent lipid tests were reviewed and are variable. Exacerbating diseases include chronic renal disease, diabetes and obesity. Factors aggravating her hyperlipidemia include beta blockers and fatty foods. Pertinent negatives include no chest pain, myalgias or shortness of breath. Current antihyperlipidemic treatment includes statins. The current treatment provides moderate improvement of lipids. Compliance problems include adherence to diet and adherence to exercise.  Risk factors for coronary artery disease include hypertension, dyslipidemia, a sedentary lifestyle, post-menopausal, diabetes mellitus and obesity.  Hypertension This is a chronic problem. The current episode started more than 1 year ago. The problem has been gradually improving since onset. The problem is controlled. Pertinent negatives include no chest pain, headaches, palpitations or shortness of breath. There are no associated agents to hypertension. Risk factors for coronary artery disease include dyslipidemia, diabetes mellitus, family history, post-menopausal state, obesity and sedentary lifestyle. Past treatments include ACE inhibitors and beta blockers. The current treatment provides moderate improvement. There are no compliance problems.  Hypertensive  end-organ damage includes kidney disease. Identifiable causes of hypertension include chronic renal disease.    Review of systems  Constitutional: + stable body weight,  current Body mass index is 37.61 kg/m. , no fatigue, no subjective hyperthermia, no subjective hypothermia Eyes: no blurry vision, no xerophthalmia ENT: no sore throat, no nodules palpated in throat, no dysphagia/odynophagia, no hoarseness Cardiovascular: no chest pain, no shortness of breath, no palpitations, no leg swelling Respiratory: no cough, no shortness of breath Gastrointestinal: no nausea/vomiting/diarrhea Musculoskeletal: no muscle/joint aches Skin: no rashes, no hyperemia Neurological: no tremors, no numbness, no tingling, no dizziness Psychiatric: no depression, no anxiety   Objective:    BP 101/66 (BP Location: Left Arm, Patient Position: Sitting, Cuff Size: Large)   Ht 5\' 5"  (1.651 m)   Wt 226 lb (102.5 kg)   BMI 37.61 kg/m   Wt Readings from Last 3 Encounters:  07/22/23 226 lb (102.5 kg)  01/17/23 234 lb 6.4 oz (106.3 kg)  07/17/22 230 lb (104.3 kg)      BP Readings from Last 3 Encounters:  07/22/23 101/66  01/17/23 95/62  07/17/22 103/68     Physical Exam- Limited  Constitutional:  Body mass index  is 37.61 kg/m. , not in acute distress, normal state of mind Eyes:  EOMI, no exophthalmos Musculoskeletal: no gross deformities, strength intact in all four extremities, no gross restriction of joint movements Skin:  no rashes, no hyperemia Neurological: no tremor with outstretched hands  Diabetic Foot Exam - Simple   Simple Foot Form Diabetic Foot exam was performed with the following findings: Yes 07/22/2023  8:32 AM  Visual Inspection No deformities, no ulcerations, no other skin breakdown bilaterally: Yes See comments: Yes Sensation Testing Intact to touch and monofilament testing bilaterally: Yes Pulse Check Posterior Tibialis and Dorsalis pulse intact bilaterally:  Yes Comments Mild onychomycosis bilaterally      Recent Results (from the past 2160 hour(s))  HgB A1c     Status: Abnormal   Collection Time: 07/22/23  8:37 AM  Result Value Ref Range   Hemoglobin A1C 7.0 (A) 4.0 - 5.6 %   HbA1c POC (<> result, manual entry)     HbA1c, POC (prediabetic range)     HbA1c, POC (controlled diabetic range)      Lipid Panel     Component Value Date/Time   CHOL 131 01/09/2023 0935   TRIG 153 (H) 01/09/2023 0935   HDL 35 (L) 01/09/2023 0935   CHOLHDL 3.7 01/09/2023 0935   CHOLHDL 4.8 04/06/2020 1132   LDLCALC 69 01/09/2023 0935   LDLCALC 86 04/06/2020 1132   LABVLDL 27 01/09/2023 0935      Assessment & Plan:   1) Type 2 diabetes mellitus with stage 3 chronic kidney disease, with long-term current use of insulin (HCC)  - Alyssa Fox has currently uncontrolled symptomatic type 2 DM since 67 years of age.  She presents today with her CGM showing at goal glycemic profile overall.  Her POCT A1c today is 7%, increasing from previous visit of 6.2%.  Analysis of her CGM shows TIR 86%, TAR 14%, TBR 0% with a GMI of 6.9%.  - Recent labs reviewed, stable stage 3 renal insufficiency.  -her diabetes is complicated by coronary artery disease which required stent placement in 2015, renal insufficiency, obesity/sedentary life and Alyssa Fox remains at a high risk for more acute and chronic complications which include CAD, CVA, CKD, retinopathy, and neuropathy. These are all discussed in detail with the patient.  - Nutritional counseling repeated at each appointment due to patients tendency to fall back in to old habits.  - The patient admits there is a room for improvement in their diet and drink choices. -  Suggestion is made for the patient to avoid simple carbohydrates from their diet including Cakes, Sweet Desserts / Pastries, Ice Cream, Soda (diet and regular), Sweet Tea, Candies, Chips, Cookies, Sweet Pastries, Store Bought Juices, Alcohol  in Excess of 1-2 drinks a day, Artificial Sweeteners, Coffee Creamer, and "Sugar-free" Products. This will help patient to have stable blood glucose profile and potentially avoid unintended weight gain.   - I encouraged the patient to switch to unprocessed or minimally processed complex starch and increased protein intake (animal or plant source), fruits, and vegetables.   - Patient is advised to stick to a routine mealtimes to eat 3 meals a day and avoid unnecessary snacks (to snack only to correct hypoglycemia).  - I have approached her with the following individualized plan to manage diabetes and patient agrees:   -Given her stable glycemic profile, no changes will be made to her medications today.  She is advised to continue Ozempic 2 mg SQ weekly.   -She  is encouraged to monitor blood glucose at least twice daily(using her CGM provided for her by her employer), before breakfast and before bed and report readings less than 70 or greater than 200 for 3 tests in a row.    - Patient is warned not to take insulin without proper monitoring per orders.  2) BP/HTN:  Her blood pressure is controlled to target.  She is advised to continue Lisinopril 5 mg po daily and Bisoprolol 5 mg po daily.  3) Lipids/HPL:  Her recent lipid panel on 01/09/23 shows controlled LDL of 69 and elevated triglycerides of 153 (improving).  She is advised to continue Atorvastatin 40 mg po daily at bedtime.  Side effects and precautions discussed with her.    4) Weight management- Her Body mass index is 37.61 kg/m.Marland Kitchen  She is a candidate for modest weight loss and continues to work hard towards this.     5) Vitamin D Deficiency Her most recent vitamin D level from 01/09/23 was 73.5.  She continues on vitamin D supplementation.   6) Chronic Care/Health Maintenance: -she is on ACEI/ARB and Statin medications and is encouraged to continue to follow up with Ophthalmology, Dentist, Podiatrist at least yearly or according to  recommendations, and advised to stay away from smoking. I have recommended yearly flu vaccine and pneumonia vaccination at least every 5 years; moderate intensity exercise for up to 150 minutes weekly; and  sleep for at least 7 hours a day.  - I advised patient to maintain close follow up with Jonetta Speak, MD for primary care needs.     I spent  37  minutes in the care of the patient today including review of labs from CMP, Lipids, Thyroid Function, Hematology (current and previous including abstractions from other facilities); face-to-face time discussing  her blood glucose readings/logs, discussing hypoglycemia and hyperglycemia episodes and symptoms, medications doses, her options of short and long term treatment based on the latest standards of care / guidelines;  discussion about incorporating lifestyle medicine;  and documenting the encounter. Risk reduction counseling performed per USPSTF guidelines to reduce obesity and cardiovascular risk factors.     Please refer to Patient Instructions for Blood Glucose Monitoring and Insulin/Medications Dosing Guide"  in media tab for additional information. Please  also refer to " Patient Self Inventory" in the Media  tab for reviewed elements of pertinent patient history.  Alyssa Fox participated in the discussions, expressed understanding, and voiced agreement with the above plans.  All questions were answered to her satisfaction. she is encouraged to contact clinic should she have any questions or concerns prior to her return visit.    Follow up plan: - Return in about 4 months (around 11/21/2023) for Diabetes F/U with A1c in office, No previsit labs, Bring meter and logs.  Ronny Bacon, [facility] [address] [place], [place] [address] Phone: [phone] Fax: [phone]  07/22/2023, 8:42 AM

## 2023-07-25 ENCOUNTER — Other Ambulatory Visit: Payer: Self-pay | Admitting: Nurse Practitioner

## 2023-07-25 DIAGNOSIS — Z794 Long term (current) use of insulin: Secondary | ICD-10-CM

## 2023-10-03 ENCOUNTER — Other Ambulatory Visit: Payer: Self-pay | Admitting: Nurse Practitioner

## 2023-10-09 ENCOUNTER — Other Ambulatory Visit: Payer: Self-pay

## 2023-10-09 MED ORDER — VITAMIN D (ERGOCALCIFEROL) 1.25 MG (50000 UNIT) PO CAPS: 50000.0000 [IU] | ORAL_CAPSULE | ORAL | 0 refills | Status: DC

## 2023-11-04 ENCOUNTER — Other Ambulatory Visit: Payer: Self-pay | Admitting: Nurse Practitioner

## 2023-11-21 ENCOUNTER — Ambulatory Visit: Payer: 59 | Admitting: Nurse Practitioner

## 2023-11-21 DIAGNOSIS — E559 Vitamin D deficiency, unspecified: Secondary | ICD-10-CM

## 2023-11-21 DIAGNOSIS — Z7985 Long-term (current) use of injectable non-insulin antidiabetic drugs: Secondary | ICD-10-CM

## 2023-11-21 DIAGNOSIS — Z794 Long term (current) use of insulin: Secondary | ICD-10-CM

## 2023-11-21 DIAGNOSIS — I10 Essential (primary) hypertension: Secondary | ICD-10-CM

## 2023-11-21 DIAGNOSIS — E782 Mixed hyperlipidemia: Secondary | ICD-10-CM

## 2023-11-25 ENCOUNTER — Encounter: Payer: Self-pay | Admitting: Nurse Practitioner

## 2023-11-25 ENCOUNTER — Ambulatory Visit (INDEPENDENT_AMBULATORY_CARE_PROVIDER_SITE_OTHER): Payer: 59 | Admitting: Nurse Practitioner

## 2023-11-25 VITALS — BP 131/74 | HR 76 | Ht 65.0 in | Wt 223.0 lb

## 2023-11-25 DIAGNOSIS — E782 Mixed hyperlipidemia: Secondary | ICD-10-CM | POA: Diagnosis not present

## 2023-11-25 DIAGNOSIS — E559 Vitamin D deficiency, unspecified: Secondary | ICD-10-CM | POA: Diagnosis not present

## 2023-11-25 DIAGNOSIS — N1831 Chronic kidney disease, stage 3a: Secondary | ICD-10-CM | POA: Diagnosis not present

## 2023-11-25 DIAGNOSIS — I10 Essential (primary) hypertension: Secondary | ICD-10-CM

## 2023-11-25 DIAGNOSIS — Z794 Long term (current) use of insulin: Secondary | ICD-10-CM | POA: Diagnosis not present

## 2023-11-25 DIAGNOSIS — E1122 Type 2 diabetes mellitus with diabetic chronic kidney disease: Secondary | ICD-10-CM

## 2023-11-25 DIAGNOSIS — Z7985 Long-term (current) use of injectable non-insulin antidiabetic drugs: Secondary | ICD-10-CM

## 2023-11-25 LAB — POCT GLYCOSYLATED HEMOGLOBIN (HGB A1C): Hemoglobin A1C: 7.7 % — AB (ref 4.0–5.6)

## 2023-11-25 MED ORDER — TIRZEPATIDE 10 MG/0.5ML ~~LOC~~ SOAJ: 10.0000 mg | SUBCUTANEOUS | 1 refills | Status: DC

## 2023-11-25 NOTE — Progress Notes (Signed)
11/25/2023, 9:14 AM                 Endocrinology follow-up note   Subjective:    [name], female    DOB: 19-Dec-1955.  Alyssa Fox is being seen in follow-up  for management of currently uncontrolled symptomatic 2 diabetes, hyperlipidemia, hypertension, obesity. PMD:   Alyssa Speak, MD.   Past Medical History:  Diagnosis Date   DM type 2 causing vascular disease (HCC)    Hypertension    MI (myocardial infarction) (HCC)    Past Surgical History:  Procedure Laterality Date   CHOLECYSTECTOMY     CORONARY ANGIOPLASTY WITH STENT PLACEMENT     TUBAL LIGATION     Social History   Socioeconomic History   Marital status: Widowed    Spouse name: Not on file   Number of children: Not on file   Years of education: Not on file   Highest education level: Not on file  Occupational History   Not on file  Tobacco Use   Smoking status: Never   Smokeless tobacco: Never  Vaping Use   Vaping status: Never Used  Substance and Sexual Activity   Alcohol use: No   Drug use: No   Sexual activity: Not on file  Other Topics Concern   Not on file  Social History Narrative   Not on file   Social Drivers of Health   Financial Resource Strain: Not on file  Food Insecurity: Not on file  Transportation Needs: Not on file  Physical Activity: Not on file  Stress: Not on file  Social Connections: Not on file   Outpatient Encounter Medications as of 11/25/2023  Medication Sig   allopurinol (ZYLOPRIM) 100 MG tablet every morning.   atorvastatin (LIPITOR) 40 MG tablet TAKE 1 TABLET BY MOUTH  DAILY   bisoprolol (ZEBETA) 5 MG tablet Take 5 mg by mouth daily.   clopidogrel (PLAVIX) 75 MG tablet 1 tablet daily.   gabapentin (NEURONTIN) 100 MG capsule TAKE 1 CAPSULE BY MOUTH 3 TIMES  DAILY   lisinopril (ZESTRIL) 5 MG tablet TAKE 1 TABLET BY MOUTH  DAILY   nitroGLYCERIN (NITROSTAT) 0.4 MG SL tablet Place 0.4 mg under the tongue  every 5 (five) minutes as needed for chest pain.   tirzepatide (MOUNJARO) 10 MG/0.5ML Pen Inject 10 mg into the skin once a week.   Vitamin D, Ergocalciferol, (DRISDOL) 1.25 MG (50000 UNIT) CAPS capsule Take 1 capsule (50,000 Units total) by mouth every 7 (seven) days.   [DISCONTINUED] Semaglutide, 2 MG/DOSE, (OZEMPIC, 2 MG/DOSE,) 8 MG/3ML SOPN INJECT SUBCUTANEOUSLY 2 MG EVERY WEEK   aspirin EC 81 MG tablet Take by mouth daily.   Insulin Pen Needle (PEN NEEDLES) 31G X 8 MM MISC Use as directed twice daily   ONETOUCH ULTRA test strip 1 each 3 (three) times daily.   No facility-administered encounter medications on file as of 11/25/2023.    ALLERGIES: Allergies  Allergen Reactions   Latex Swelling   Tape Swelling    Other reaction(s): Unknown    VACCINATION STATUS:  There is no immunization history on file for this patient.  Diabetes She presents for her follow-up diabetic visit. She has type 2 diabetes mellitus. Onset time: She was diagnosed at approximate age of 49 years. Her disease course has been worsening. There are no hypoglycemic associated symptoms. Pertinent negatives for hypoglycemia include no confusion, headaches,  pallor or seizures. Pertinent negatives for diabetes include no chest pain, no polydipsia, no polyphagia, no polyuria and no weight loss. There are no hypoglycemic complications. Symptoms are stable. Diabetic complications include heart disease and peripheral neuropathy. (She is status post 3 stent placement for coronary artery disease and December 2015-about the time of her diagnosis with diabetes.) Risk factors for coronary artery disease include dyslipidemia, diabetes mellitus, family history, obesity, hypertension, post-menopausal and sedentary lifestyle. Current diabetic treatments: Ozempic only. She is compliant with treatment most of the time. Her weight is fluctuating minimally. She is following a generally healthy diet. When asked about meal planning, she  reported none. She has not had a previous visit with a dietitian. She rarely participates in exercise. Her home blood glucose trend is increasing steadily. Her overall blood glucose range is 180-200 mg/dl. (She presents today with her CGM showing above target glycemic profile overall.  Her POCT A1c today is 7.7%, increasing from previous visit of 7%.  Analysis of her CGM shows TIR 40%, TAR 60%, TBR 0% with a GMI of 7.9%.  She notes she did get put on oral steroids for neck problems about 3 weeks ago and she has struggled getting her glucose back under control since then.) An ACE inhibitor/angiotensin II receptor blocker is being taken. She does not see a podiatrist.Eye exam is current.  Hyperlipidemia This is a chronic problem. The current episode started more than 1 year ago. The problem is controlled. Recent lipid tests were reviewed and are variable. Exacerbating diseases include chronic renal disease, diabetes and obesity. Factors aggravating her hyperlipidemia include beta blockers and fatty foods. Pertinent negatives include no chest pain, myalgias or shortness of breath. Current antihyperlipidemic treatment includes statins. The current treatment provides moderate improvement of lipids. Compliance problems include adherence to diet and adherence to exercise.  Risk factors for coronary artery disease include hypertension, dyslipidemia, a sedentary lifestyle, post-menopausal, diabetes mellitus and obesity.  Hypertension This is a chronic problem. The current episode started more than 1 year ago. The problem has been gradually improving since onset. The problem is controlled. Pertinent negatives include no chest pain, headaches, palpitations or shortness of breath. There are no associated agents to hypertension. Risk factors for coronary artery disease include dyslipidemia, diabetes mellitus, family history, post-menopausal state, obesity and sedentary lifestyle. Past treatments include ACE inhibitors and  beta blockers. The current treatment provides moderate improvement. There are no compliance problems.  Hypertensive end-organ damage includes kidney disease. Identifiable causes of hypertension include chronic renal disease.    Review of systems  Constitutional: + steadily decreasing body weight,  current Body mass index is 37.11 kg/m. , no fatigue, no subjective hyperthermia, no subjective hypothermia Eyes: no blurry vision, no xerophthalmia ENT: no sore throat, no nodules palpated in throat, no dysphagia/odynophagia, no hoarseness Cardiovascular: no chest pain, no shortness of breath, no palpitations, no leg swelling Respiratory: no cough, no shortness of breath Gastrointestinal: no nausea/vomiting/diarrhea Musculoskeletal: no muscle/joint aches Skin: no rashes, no hyperemia Neurological: no tremors, no numbness, no tingling, no dizziness Psychiatric: no depression, no anxiety   Objective:    BP 131/74 (BP Location: Left Arm, Cuff Size: Large)   Pulse 76   Ht 5\' 5"  (1.651 m)   Wt 223 lb (101.2 kg)   BMI 37.11 kg/m   Wt Readings from Last 3 Encounters:  11/25/23 223 lb (101.2 kg)  07/22/23 226 lb (102.5 kg)  01/17/23 234 lb 6.4 oz (106.3 kg)      BP Readings from Last  3 Encounters:  11/25/23 131/74  07/22/23 101/66  01/17/23 95/62     Physical Exam- Limited  Constitutional:  Body mass index is 37.11 kg/m. , not in acute distress, normal state of mind Eyes:  EOMI, no exophthalmos Musculoskeletal: no gross deformities, strength intact in all four extremities, no gross restriction of joint movements Skin:  no rashes, no hyperemia Neurological: no tremor with outstretched hands  Diabetic Foot Exam - Simple   No data filed      Recent Results (from the past 2160 hours)  HgB A1c     Status: Abnormal   Collection Time: 11/25/23  8:39 AM  Result Value Ref Range   Hemoglobin A1C 7.7 (A) 4.0 - 5.6 %   HbA1c POC (<> result, manual entry)     HbA1c, POC (prediabetic  range)     HbA1c, POC (controlled diabetic range)       Lipid Panel     Component Value Date/Time   CHOL 131 01/09/2023 0935   TRIG 153 (H) 01/09/2023 0935   HDL 35 (L) 01/09/2023 0935   CHOLHDL 3.7 01/09/2023 0935   CHOLHDL 4.8 04/06/2020 1132   LDLCALC 69 01/09/2023 0935   LDLCALC 86 04/06/2020 1132   LABVLDL 27 01/09/2023 0935      Assessment & Plan:   1) Type 2 diabetes mellitus with stage 3 chronic kidney disease, with long-term current use of insulin (HCC)  - Alyssa Fox has currently uncontrolled symptomatic type 2 DM since 67 years of age.  She presents today with her CGM showing above target glycemic profile overall.  Her POCT A1c today is 7.7%, increasing from previous visit of 7%.  Analysis of her CGM shows TIR 40%, TAR 60%, TBR 0% with a GMI of 7.9%.  She notes she did get put on oral steroids for neck problems about 3 weeks ago and she has struggled getting her glucose back under control since then.  - Recent labs reviewed, stable stage 3 renal insufficiency.  -her diabetes is complicated by coronary artery disease which required stent placement in 2015, renal insufficiency, obesity/sedentary life and Alyssa Fox remains at a high risk for more acute and chronic complications which include CAD, CVA, CKD, retinopathy, and neuropathy. These are all discussed in detail with the patient.  - Nutritional counseling repeated at each appointment due to patients tendency to fall back in to old habits.  - The patient admits there is a room for improvement in their diet and drink choices. -  Suggestion is made for the patient to avoid simple carbohydrates from their diet including Cakes, Sweet Desserts / Pastries, Ice Cream, Soda (diet and regular), Sweet Tea, Candies, Chips, Cookies, Sweet Pastries, Store Bought Juices, Alcohol in Excess of 1-2 drinks a day, Artificial Sweeteners, Coffee Creamer, and "Sugar-free" Products. This will help patient to have stable  blood glucose profile and potentially avoid unintended weight gain.   - I encouraged the patient to switch to unprocessed or minimally processed complex starch and increased protein intake (animal or plant source), fruits, and vegetables.   - Patient is advised to stick to a routine mealtimes to eat 3 meals a day and avoid unnecessary snacks (to snack only to correct hypoglycemia).  - I have approached her with the following individualized plan to manage diabetes and patient agrees:   -Given her worsening glycemic profile, will change her to Mounjaro 10 mg SQ weekly (she does not need to start on lowest dose as she has been on  highest dose of Ozempic and tolerated it well).    -She is encouraged to monitor blood glucose at least twice daily(using her CGM provided for her by her employer), before breakfast and before bed and report readings less than 70 or greater than 200 for 3 tests in a row.    - Patient is warned not to take insulin without proper monitoring per orders.  2) BP/HTN:  Her blood pressure is controlled to target.  She is advised to continue Lisinopril 5 mg po daily and Bisoprolol 5 mg po daily.  3) Lipids/HPL:  Her recent lipid panel on 01/09/23 shows controlled LDL of 69 and elevated triglycerides of 153 (improving).  She is advised to continue Atorvastatin 40 mg po daily at bedtime.  Side effects and precautions discussed with her.    4) Weight management- Her Body mass index is 37.11 kg/m.Marland Kitchen  She is a candidate for modest weight loss and continues to work hard towards this.     5) Vitamin D Deficiency Her most recent vitamin D level from 01/09/23 was 73.5.  She continues on vitamin D supplementation.   6) Chronic Care/Health Maintenance: -she is on ACEI/ARB and Statin medications and is encouraged to continue to follow up with Ophthalmology, Dentist, Podiatrist at least yearly or according to recommendations, and advised to stay away from smoking. I have recommended yearly  flu vaccine and pneumonia vaccination at least every 5 years; moderate intensity exercise for up to 150 minutes weekly; and  sleep for at least 7 hours a day.  - I advised patient to maintain close follow up with Alyssa Speak, MD for primary care needs.     I spent  40  minutes in the care of the patient today including review of labs from CMP, Lipids, Thyroid Function, Hematology (current and previous including abstractions from other facilities); face-to-face time discussing  her blood glucose readings/logs, discussing hypoglycemia and hyperglycemia episodes and symptoms, medications doses, her options of short and long term treatment based on the latest standards of care / guidelines;  discussion about incorporating lifestyle medicine;  and documenting the encounter. Risk reduction counseling performed per USPSTF guidelines to reduce obesity and cardiovascular risk factors.     Please refer to Patient Instructions for Blood Glucose Monitoring and Insulin/Medications Dosing Guide"  in media tab for additional information. Please  also refer to " Patient Self Inventory" in the Media  tab for reviewed elements of pertinent patient history.  Wilson Singer participated in the discussions, expressed understanding, and voiced agreement with the above plans.  All questions were answered to her satisfaction. she is encouraged to contact clinic should she have any questions or concerns prior to her return visit.    Follow up plan: - Return in about 4 months (around 03/25/2024) for Diabetes F/U with A1c in office, No previsit labs, Bring meter and logs.  Ronny Bacon, [facility] [address] [place], [place] [address] Phone: [phone] Fax: [phone]  11/25/2023, 9:14 AM

## 2023-12-24 ENCOUNTER — Other Ambulatory Visit: Payer: Self-pay | Admitting: Nurse Practitioner

## 2024-01-12 ENCOUNTER — Other Ambulatory Visit: Payer: Self-pay | Admitting: Nurse Practitioner

## 2024-01-12 DIAGNOSIS — E1122 Type 2 diabetes mellitus with diabetic chronic kidney disease: Secondary | ICD-10-CM

## 2024-01-29 ENCOUNTER — Telehealth: Payer: Self-pay | Admitting: *Deleted

## 2024-01-29 MED ORDER — TIRZEPATIDE 12.5 MG/0.5ML ~~LOC~~ SOAJ: 12.5000 mg | SUBCUTANEOUS | 1 refills | Status: DC

## 2024-01-29 NOTE — Telephone Encounter (Signed)
 Yes we can increase.  I will send in the order to [facility] for 12.5 mg weekly dose.

## 2024-01-29 NOTE — Telephone Encounter (Signed)
 Patient was called and made aware.

## 2024-01-29 NOTE — Telephone Encounter (Signed)
 Patient left a message. She shared that she has her CGM through her work and has a Engineer, civil (consulting) that is assigned to her. They meet every 3 months and they monitor her. She met today. She shares that she is only in the 40 % range and not in the 70 % as she should be. The nurse ask that she call [facility] and see if she would increase her Monujaro? Patient is currently injecting Mounjaro 10 mg weekly. Patient ask that this be sent into [facility] and she would appreciate a call back to let her know what is going on. Printing her CGM for review.

## 2024-03-09 ENCOUNTER — Other Ambulatory Visit: Payer: Self-pay | Admitting: Nurse Practitioner

## 2024-03-25 ENCOUNTER — Ambulatory Visit (INDEPENDENT_AMBULATORY_CARE_PROVIDER_SITE_OTHER): Payer: 59 | Admitting: Nurse Practitioner

## 2024-03-25 ENCOUNTER — Encounter: Payer: Self-pay | Admitting: Nurse Practitioner

## 2024-03-25 VITALS — BP 104/68 | HR 75 | Ht 65.0 in | Wt 219.8 lb

## 2024-03-25 DIAGNOSIS — I10 Essential (primary) hypertension: Secondary | ICD-10-CM | POA: Diagnosis not present

## 2024-03-25 DIAGNOSIS — E559 Vitamin D deficiency, unspecified: Secondary | ICD-10-CM | POA: Diagnosis not present

## 2024-03-25 DIAGNOSIS — N1831 Chronic kidney disease, stage 3a: Secondary | ICD-10-CM

## 2024-03-25 DIAGNOSIS — E782 Mixed hyperlipidemia: Secondary | ICD-10-CM

## 2024-03-25 DIAGNOSIS — Z7985 Long-term (current) use of injectable non-insulin antidiabetic drugs: Secondary | ICD-10-CM

## 2024-03-25 DIAGNOSIS — E1122 Type 2 diabetes mellitus with diabetic chronic kidney disease: Secondary | ICD-10-CM | POA: Diagnosis not present

## 2024-03-25 DIAGNOSIS — Z794 Long term (current) use of insulin: Secondary | ICD-10-CM | POA: Diagnosis not present

## 2024-03-25 LAB — POCT GLYCOSYLATED HEMOGLOBIN (HGB A1C): Hemoglobin A1C: 7.6 % — AB (ref 4.0–5.6)

## 2024-03-25 LAB — POCT UA - MICROALBUMIN
Creatinine, POC: 300 mg/dL
Microalbumin Ur, POC: 150 mg/L

## 2024-03-25 MED ORDER — TIRZEPATIDE 12.5 MG/0.5ML ~~LOC~~ SOAJ: 12.5000 mg | SUBCUTANEOUS | 1 refills | Status: DC

## 2024-03-25 NOTE — Progress Notes (Signed)
 03/25/2024, 8:49 AM                 Endocrinology follow-up note   Subjective:    [name], female    DOB: 1956/07/12.  Alyssa Fox is being seen in follow-up  for management of currently uncontrolled symptomatic 2 diabetes, hyperlipidemia, hypertension, obesity. PMD:   Aloysius Janus, MD.   Past Medical History:  Diagnosis Date   DM type 2 causing vascular disease (HCC)    Hypertension    MI (myocardial infarction) (HCC)    Past Surgical History:  Procedure Laterality Date   CHOLECYSTECTOMY     CORONARY ANGIOPLASTY WITH STENT PLACEMENT     TUBAL LIGATION     Social History   Socioeconomic History   Marital status: Widowed    Spouse name: Not on file   Number of children: Not on file   Years of education: Not on file   Highest education level: Not on file  Occupational History   Not on file  Tobacco Use   Smoking status: Never   Smokeless tobacco: Never  Vaping Use   Vaping status: Never Used  Substance and Sexual Activity   Alcohol use: No   Drug use: No   Sexual activity: Not on file  Other Topics Concern   Not on file  Social History Narrative   Not on file   Social Drivers of Health   Financial Resource Strain: Not on file  Food Insecurity: Not on file  Transportation Needs: Not on file  Physical Activity: Not on file  Stress: Not on file  Social Connections: Not on file   Outpatient Encounter Medications as of 03/25/2024  Medication Sig   allopurinol (ZYLOPRIM) 100 MG tablet every morning.   atorvastatin  (LIPITOR) 40 MG tablet TAKE 1 TABLET BY MOUTH  DAILY   bisoprolol (ZEBETA) 5 MG tablet Take 5 mg by mouth daily.   clopidogrel (PLAVIX) 75 MG tablet 1 tablet daily.   gabapentin  (NEURONTIN ) 100 MG capsule TAKE 1 CAPSULE BY MOUTH 3 TIMES  DAILY   lisinopril  (ZESTRIL ) 5 MG tablet TAKE 1 TABLET BY MOUTH  DAILY   nitroGLYCERIN (NITROSTAT) 0.4 MG SL tablet Place 0.4 mg under the tongue  every 5 (five) minutes as needed for chest pain.   ONETOUCH ULTRA test strip 1 each 3 (three) times daily.   Vitamin D , Ergocalciferol , (DRISDOL ) 1.25 MG (50000 UNIT) CAPS capsule TAKE 1 CAPSULE BY MOUTH EVERY 7  DAYS   [DISCONTINUED] tirzepatide (MOUNJARO) 12.5 MG/0.5ML Pen Inject 12.5 mg into the skin once a week.   tirzepatide (MOUNJARO) 12.5 MG/0.5ML Pen Inject 12.5 mg into the skin once a week.   No facility-administered encounter medications on file as of 03/25/2024.    ALLERGIES: Allergies  Allergen Reactions   Latex Swelling   Tape Swelling    Other reaction(s): Unknown    VACCINATION STATUS:  There is no immunization history on file for this patient.  Diabetes She presents for her follow-up diabetic visit. She has type 2 diabetes mellitus. Onset time: She was diagnosed at approximate age of 58 years. Her disease course has been improving. There are no hypoglycemic associated symptoms. Pertinent negatives for hypoglycemia include no confusion, headaches, pallor or seizures. Pertinent negatives for diabetes include no chest pain, no polydipsia, no polyphagia, no polyuria and no weight loss. There are no hypoglycemic complications. Symptoms are stable. Diabetic complications include  heart disease and peripheral neuropathy. (She is status post 3 stent placement for coronary artery disease and December 2015-about the time of her diagnosis with diabetes.) Risk factors for coronary artery disease include dyslipidemia, diabetes mellitus, family history, obesity, hypertension, post-menopausal and sedentary lifestyle. Current diabetic treatments: Mounjaro only. She is compliant with treatment most of the time. Her weight is fluctuating minimally. She is following a generally healthy diet. When asked about meal planning, she reported none. She has not had a previous visit with a dietitian. She rarely participates in exercise. Her home blood glucose trend is decreasing steadily. Her overall blood  glucose range is 140-180 mg/dl. (She presents today with her CGM showing mostly at target glycemic profile overall.  Her POCT A1c today is 7.6%, improving from previous visit of 7.7%.  Analysis of her CGM shows TIR 79%, TAR 21%, TBR 0% with a GMI of 7.1%.  ) An ACE inhibitor/angiotensin II receptor blocker is being taken. She does not see a podiatrist.Eye exam is current.  Hyperlipidemia This is a chronic problem. The current episode started more than 1 year ago. The problem is controlled. Recent lipid tests were reviewed and are variable. Exacerbating diseases include chronic renal disease, diabetes and obesity. Factors aggravating her hyperlipidemia include beta blockers and fatty foods. Pertinent negatives include no chest pain, myalgias or shortness of breath. Current antihyperlipidemic treatment includes statins. The current treatment provides moderate improvement of lipids. Compliance problems include adherence to diet and adherence to exercise.  Risk factors for coronary artery disease include hypertension, dyslipidemia, a sedentary lifestyle, post-menopausal, diabetes mellitus and obesity.  Hypertension This is a chronic problem. The current episode started more than 1 year ago. The problem has been gradually improving since onset. The problem is controlled. Pertinent negatives include no chest pain, headaches, palpitations or shortness of breath. There are no associated agents to hypertension. Risk factors for coronary artery disease include dyslipidemia, diabetes mellitus, family history, post-menopausal state, obesity and sedentary lifestyle. Past treatments include ACE inhibitors and beta blockers. The current treatment provides moderate improvement. There are no compliance problems.  Hypertensive end-organ damage includes kidney disease. Identifiable causes of hypertension include chronic renal disease.    Review of systems  Constitutional: + steadily decreasing body weight,  current Body mass  index is 36.58 kg/m. , no fatigue, no subjective hyperthermia, no subjective hypothermia Eyes: no blurry vision, no xerophthalmia ENT: no sore throat, no nodules palpated in throat, no dysphagia/odynophagia, no hoarseness Cardiovascular: no chest pain, no shortness of breath, no palpitations, no leg swelling Respiratory: no cough, no shortness of breath Gastrointestinal: no nausea/vomiting/diarrhea Musculoskeletal: no muscle/joint aches Skin: no rashes, no hyperemia Neurological: no tremors, no numbness, no tingling, no dizziness Psychiatric: no depression, no anxiety   Objective:    BP 104/68 (BP Location: Right Arm, Patient Position: Sitting, Cuff Size: Large)   Pulse 75   Ht 5\' 5"  (1.651 m)   Wt 219 lb 12.8 oz (99.7 kg)   BMI 36.58 kg/m   Wt Readings from Last 3 Encounters:  03/25/24 219 lb 12.8 oz (99.7 kg)  11/25/23 223 lb (101.2 kg)  07/22/23 226 lb (102.5 kg)      BP Readings from Last 3 Encounters:  03/25/24 104/68  11/25/23 131/74  07/22/23 101/66     Physical Exam- Limited  Constitutional:  Body mass index is 36.58 kg/m. , not in acute distress, normal state of mind Eyes:  EOMI, no exophthalmos Musculoskeletal: no gross deformities, strength intact in all four extremities, no gross  restriction of joint movements Skin:  no rashes, no hyperemia Neurological: no tremor with outstretched hands  Diabetic Foot Exam - Simple   No data filed      Recent Results (from the past 2160 hours)  POCT UA - Microalbumin     Status: Abnormal (Preliminary result)   Collection Time: 03/25/24  8:40 AM  Result Value Ref Range   Microalbumin Ur, POC 150 mg/L mg/L   Creatinine, POC 300 mg /dL mg/dL   Albumin/Creatinine Ratio, Urine, POC 30-300 mg/G   HgB A1c     Status: Abnormal   Collection Time: 03/25/24  8:41 AM  Result Value Ref Range   Hemoglobin A1C 7.6 (A) 4.0 - 5.6 %   HbA1c POC (<> result, manual entry)     HbA1c, POC (prediabetic range)     HbA1c, POC  (controlled diabetic range)        Lipid Panel     Component Value Date/Time   CHOL 131 01/09/2023 0935   TRIG 153 (H) 01/09/2023 0935   HDL 35 (L) 01/09/2023 0935   CHOLHDL 3.7 01/09/2023 0935   CHOLHDL 4.8 04/06/2020 1132   LDLCALC 69 01/09/2023 0935   LDLCALC 86 04/06/2020 1132   LABVLDL 27 01/09/2023 0935      Assessment & Plan:   1) Type 2 diabetes mellitus with stage 3 chronic kidney disease, with long-term current use of insulin  (HCC)  - Alyssa Fox has currently uncontrolled symptomatic type 2 DM since 68 years of age.  She presents today with her CGM showing mostly at target glycemic profile overall.  Her POCT A1c today is 7.6%, improving from previous visit of 7.7%.  Analysis of her CGM shows TIR 79%, TAR 21%, TBR 0% with a GMI of 7.1%.    - Recent labs reviewed, stable stage 3 renal insufficiency.  POCT UM shows mild microalbuminuria, consistent with recent labs showing moderate kidney impairment.  She notes she was put back on Meloxicam for OA.  -her diabetes is complicated by coronary artery disease which required stent placement in 2015, renal insufficiency, obesity/sedentary life and Alyssa Fox remains at a high risk for more acute and chronic complications which include CAD, CVA, CKD, retinopathy, and neuropathy. These are all discussed in detail with the patient.  - Nutritional counseling repeated at each appointment due to patients tendency to fall back in to old habits.  - The patient admits there is a room for improvement in their diet and drink choices. -  Suggestion is made for the patient to avoid simple carbohydrates from their diet including Cakes, Sweet Desserts / Pastries, Ice Cream, Soda (diet and regular), Sweet Tea, Candies, Chips, Cookies, Sweet Pastries, Store Bought Juices, Alcohol in Excess of 1-2 drinks a day, Artificial Sweeteners, Coffee Creamer, and "Sugar-free" Products. This will help patient to have stable blood glucose  profile and potentially avoid unintended weight gain.   - I encouraged the patient to switch to unprocessed or minimally processed complex starch and increased protein intake (animal or plant source), fruits, and vegetables.   - Patient is advised to stick to a routine mealtimes to eat 3 meals a day and avoid unnecessary snacks (to snack only to correct hypoglycemia).  - I have approached her with the following individualized plan to manage diabetes and patient agrees:   -She is advised to continue Mounjaro 12.5 mg SQ weekly for now, has tolerated this well with no unpleasant side effects.  -She is encouraged to monitor blood glucose at  least twice daily(using her CGM provided for her by her employer), before breakfast and before bed and report readings less than 70 or greater than 200 for 3 tests in a row.    - Patient is warned not to take insulin  without proper monitoring per orders.  2) BP/HTN:  Her blood pressure is controlled to target.  She is advised to continue Lisinopril  5 mg po daily and Bisoprolol 5 mg po daily.  3) Lipids/HPL:  Her recent lipid panel on 12/31/23 shows controlled LDL of 82 and elevated triglycerides of 249 (worsening).  She is advised to continue Atorvastatin  40 mg po daily at bedtime.  Side effects and precautions discussed with her.    4) Weight management- Her Body mass index is 36.58 kg/m.Alyssa Fox  She is a candidate for modest weight loss and continues to work hard towards this.     5) Vitamin D  Deficiency Her most recent vitamin D  level from 01/09/23 was 73.5.  She continues on vitamin D  supplementation.   6) Chronic Care/Health Maintenance: -she is on ACEI/ARB and Statin medications and is encouraged to continue to follow up with Ophthalmology, Dentist, Podiatrist at least yearly or according to recommendations, and advised to stay away from smoking. I have recommended yearly flu vaccine and pneumonia vaccination at least every 5 years; moderate intensity  exercise for up to 150 minutes weekly; and  sleep for at least 7 hours a day.  - I advised patient to maintain close follow up with Aloysius Janus, MD for primary care needs.     I spent  31  minutes in the care of the patient today including review of labs from CMP, Lipids, Thyroid Function, Hematology (current and previous including abstractions from other facilities); face-to-face time discussing  her blood glucose readings/logs, discussing hypoglycemia and hyperglycemia episodes and symptoms, medications doses, her options of short and long term treatment based on the latest standards of care / guidelines;  discussion about incorporating lifestyle medicine;  and documenting the encounter. Risk reduction counseling performed per USPSTF guidelines to reduce obesity and cardiovascular risk factors.     Please refer to Patient Instructions for Blood Glucose Monitoring and Insulin /Medications Dosing Guide"  in media tab for additional information. Please  also refer to " Patient Self Inventory" in the Media  tab for reviewed elements of pertinent patient history.  Alyssa Fox participated in the discussions, expressed understanding, and voiced agreement with the above plans.  All questions were answered to her satisfaction. she is encouraged to contact clinic should she have any questions or concerns prior to her return visit.    Follow up plan: - Return in about 4 months (around 07/25/2024) for Diabetes F/U with A1c in office, No previsit labs.  Hulon Magic, [facility] [address] [place], [place] [address] Phone: [phone] Fax: [phone]  03/25/2024, 8:49 AM

## 2024-07-01 ENCOUNTER — Other Ambulatory Visit: Payer: Self-pay | Admitting: Nurse Practitioner

## 2024-07-01 DIAGNOSIS — N1831 Chronic kidney disease, stage 3a: Secondary | ICD-10-CM

## 2024-07-29 ENCOUNTER — Ambulatory Visit: Admitting: Nurse Practitioner

## 2024-07-30 ENCOUNTER — Telehealth: Payer: Self-pay

## 2024-07-30 NOTE — Telephone Encounter (Signed)
 Tried to return call to pt's daughter Dairl), left a message requesting a return call to the office.

## 2024-08-06 ENCOUNTER — Ambulatory Visit (INDEPENDENT_AMBULATORY_CARE_PROVIDER_SITE_OTHER): Admitting: Nurse Practitioner

## 2024-08-06 ENCOUNTER — Encounter: Payer: Self-pay | Admitting: Nurse Practitioner

## 2024-08-06 VITALS — BP 100/62 | HR 69 | Ht 65.0 in | Wt 198.6 lb

## 2024-08-06 DIAGNOSIS — I10 Essential (primary) hypertension: Secondary | ICD-10-CM

## 2024-08-06 DIAGNOSIS — N1831 Chronic kidney disease, stage 3a: Secondary | ICD-10-CM

## 2024-08-06 DIAGNOSIS — E041 Nontoxic single thyroid nodule: Secondary | ICD-10-CM

## 2024-08-06 DIAGNOSIS — E782 Mixed hyperlipidemia: Secondary | ICD-10-CM

## 2024-08-06 DIAGNOSIS — Z794 Long term (current) use of insulin: Secondary | ICD-10-CM | POA: Diagnosis not present

## 2024-08-06 DIAGNOSIS — E559 Vitamin D deficiency, unspecified: Secondary | ICD-10-CM | POA: Diagnosis not present

## 2024-08-06 DIAGNOSIS — E1122 Type 2 diabetes mellitus with diabetic chronic kidney disease: Secondary | ICD-10-CM | POA: Diagnosis not present

## 2024-08-06 DIAGNOSIS — Z7985 Long-term (current) use of injectable non-insulin antidiabetic drugs: Secondary | ICD-10-CM

## 2024-08-06 LAB — POCT GLYCOSYLATED HEMOGLOBIN (HGB A1C): Hemoglobin A1C: 7.2 % — AB (ref 4.0–5.6)

## 2024-08-06 MED ORDER — TIRZEPATIDE 7.5 MG/0.5ML ~~LOC~~ SOAJ: 7.5000 mg | SUBCUTANEOUS | 1 refills | Status: AC

## 2024-08-06 NOTE — Progress Notes (Signed)
 08/06/2024, 9:52 AM                 Endocrinology follow-up note   Subjective:    [name], female    DOB: 1956-08-20.  Alyssa Fox is being seen in follow-up  for management of currently uncontrolled symptomatic 2 diabetes, hyperlipidemia, hypertension, obesity. PMD:   Jacobo Gondola, MD.   Past Medical History:  Diagnosis Date   DM type 2 causing vascular disease (HCC)    Hypertension    MI (myocardial infarction) (HCC)    Past Surgical History:  Procedure Laterality Date   CHOLECYSTECTOMY     CORONARY ANGIOPLASTY WITH STENT PLACEMENT     TUBAL LIGATION     Social History   Socioeconomic History   Marital status: Widowed    Spouse name: Not on file   Number of children: Not on file   Years of education: Not on file   Highest education level: Not on file  Occupational History   Not on file  Tobacco Use   Smoking status: Never   Smokeless tobacco: Never  Vaping Use   Vaping status: Never Used  Substance and Sexual Activity   Alcohol use: No   Drug use: No   Sexual activity: Not on file  Other Topics Concern   Not on file  Social History Narrative   Not on file   Social Drivers of Health   Financial Resource Strain: Not on file  Food Insecurity: Not on file  Transportation Needs: Not on file  Physical Activity: Not on file  Stress: Not on file  Social Connections: Not on file   Outpatient Encounter Medications as of 08/06/2024  Medication Sig   allopurinol (ZYLOPRIM) 100 MG tablet every morning.   atorvastatin  (LIPITOR) 40 MG tablet TAKE 1 TABLET BY MOUTH  DAILY   bisoprolol (ZEBETA) 5 MG tablet Take 5 mg by mouth daily.   clopidogrel (PLAVIX) 75 MG tablet 1 tablet daily.   gabapentin  (NEURONTIN ) 100 MG capsule TAKE 1 CAPSULE BY MOUTH 3 TIMES  DAILY   lisinopril  (ZESTRIL ) 5 MG tablet TAKE 1 TABLET BY MOUTH  DAILY   nitroGLYCERIN (NITROSTAT) 0.4 MG SL tablet Place 0.4 mg under the tongue every  5 (five) minutes as needed for chest pain.   tirzepatide  (MOUNJARO ) 7.5 MG/0.5ML Pen Inject 7.5 mg into the skin once a week.   Vitamin D , Ergocalciferol , (DRISDOL ) 1.25 MG (50000 UNIT) CAPS capsule TAKE 1 CAPSULE BY MOUTH EVERY 7  DAYS   [DISCONTINUED] tirzepatide  (MOUNJARO ) 12.5 MG/0.5ML Pen Inject 12.5 mg into the skin once a week.   ONETOUCH ULTRA test strip 1 each 3 (three) times daily.   No facility-administered encounter medications on file as of 08/06/2024.    ALLERGIES: Allergies  Allergen Reactions   Latex Swelling   Tape Swelling    Other reaction(s): Unknown    VACCINATION STATUS:  There is no immunization history on file for this patient.  Diabetes She presents for her follow-up diabetic visit. She has type 2 diabetes mellitus. Onset time: She was diagnosed at approximate age of 68 years. Her disease course has been stable. There are no hypoglycemic associated symptoms. Pertinent negatives for hypoglycemia include no confusion, headaches, pallor or seizures. Associated symptoms include weight loss. Pertinent negatives for diabetes include no chest pain, no polydipsia, no polyphagia and no polyuria. There are no hypoglycemic complications. Symptoms are stable. Diabetic  complications include heart disease and peripheral neuropathy. (She is status post 3 stent placement for coronary artery disease and December 2015-about the time of her diagnosis with diabetes.) Risk factors for coronary artery disease include dyslipidemia, diabetes mellitus, family history, obesity, hypertension, post-menopausal and sedentary lifestyle. Current diabetic treatments: Mounjaro  only. She is compliant with treatment most of the time. Her weight is fluctuating minimally. She is following a generally healthy diet. When asked about meal planning, she reported none. She has not had a previous visit with a dietitian. She rarely participates in exercise. Her home blood glucose trend is fluctuating minimally. Her  overall blood glucose range is 140-180 mg/dl. (She presents today, accompanied by her daughter, with her CGM showing mostly at target glycemic profile overall.  Her POCT A1c today is 7.2%, improving from previous visit of 7.6%.  Analysis of her CGM shows TIR 65%, TAR 35%, TBR 0% with a GMI of 7.4%.  She did have steroid injection not long ago and her sugars were elevated for the next several days.  Her daughter called between visits for concerns with weakness, decreased appetite, numbness, that has worsened in the last month.  She was taken to [facility] and was diagnosed with spinal stenosis.  She had CT scan which incidentally found a thyroid nodule recommending ultrasound to assess further.) An ACE inhibitor/angiotensin II receptor blocker is being taken. She does not see a podiatrist.Eye exam is current.  Hyperlipidemia This is a chronic problem. The current episode started more than 1 year ago. The problem is controlled. Recent lipid tests were reviewed and are variable. Exacerbating diseases include chronic renal disease, diabetes and obesity. Factors aggravating her hyperlipidemia include beta blockers and fatty foods. Pertinent negatives include no chest pain, myalgias or shortness of breath. Current antihyperlipidemic treatment includes statins. The current treatment provides moderate improvement of lipids. Compliance problems include adherence to diet and adherence to exercise.  Risk factors for coronary artery disease include hypertension, dyslipidemia, a sedentary lifestyle, post-menopausal, diabetes mellitus and obesity.  Hypertension This is a chronic problem. The current episode started more than 1 year ago. The problem has been gradually improving since onset. The problem is controlled. Pertinent negatives include no chest pain, headaches, palpitations or shortness of breath. There are no associated agents to hypertension. Risk factors for coronary artery disease include dyslipidemia, diabetes  mellitus, family history, post-menopausal state, obesity and sedentary lifestyle. Past treatments include ACE inhibitors and beta blockers. The current treatment provides moderate improvement. There are no compliance problems.  Hypertensive end-organ damage includes kidney disease. Identifiable causes of hypertension include chronic renal disease.    Review of systems  Constitutional: + rapidly decreasing body weight,  current Body mass index is 33.05 kg/m. , + fatigue, no subjective hyperthermia, no subjective hypothermia, + decreased appetite Eyes: no blurry vision, no xerophthalmia ENT: no sore throat, no nodules palpated in throat, no dysphagia/odynophagia, no hoarseness Cardiovascular: no chest pain, no shortness of breath, no palpitations, no leg swelling Respiratory: no cough, no shortness of breath Gastrointestinal: + nausea/vomiting/diarrhea, + reflux, + belching Musculoskeletal: + muscle weakness Skin: no rashes, no hyperemia Neurological: no tremors, + numbness/tingling- left side mainly, no dizziness Psychiatric: no depression, no anxiety   Objective:    BP 100/62 (BP Location: Left Arm, Patient Position: Sitting, Cuff Size: Large)   Pulse 69   Ht 5' 5 (1.651 m)   Wt 198 lb 9.6 oz (90.1 kg)   BMI 33.05 kg/m   Wt Readings from Last 3 Encounters:  08/06/24 198  lb 9.6 oz (90.1 kg)  03/25/24 219 lb 12.8 oz (99.7 kg)  11/25/23 223 lb (101.2 kg)      BP Readings from Last 3 Encounters:  08/06/24 100/62  03/25/24 104/68  11/25/23 131/74     Physical Exam- Limited  Constitutional:  Body mass index is 33.05 kg/m. , not in acute distress, normal state of mind Eyes:  EOMI, no exophthalmos Musculoskeletal: no gross deformities, strength intact in all four extremities, no gross restriction of joint movements Skin:  no rashes, no hyperemia Neurological: no tremor with outstretched hands   Diabetic Foot Exam - Simple   No data filed      No results found for this  or any previous visit (from the past 2160 hours).     Lipid Panel     Component Value Date/Time   CHOL 131 01/09/2023 0935   TRIG 153 (H) 01/09/2023 0935   HDL 35 (L) 01/09/2023 0935   CHOLHDL 3.7 01/09/2023 0935   CHOLHDL 4.8 04/06/2020 1132   LDLCALC 69 01/09/2023 0935   LDLCALC 86 04/06/2020 1132   LABVLDL 27 01/09/2023 0935      Assessment & Plan:   1) Type 2 diabetes mellitus with stage 3 chronic kidney disease, with long-term current use of insulin  (HCC)  - Avelina JONETTA Shutter has currently uncontrolled symptomatic type 2 DM since 68 years of age.  She presents today with her CGM showing mostly at target glycemic profile overall.  Her POCT A1c today is 7.6%, improving from previous visit of 7.7%.  Analysis of her CGM shows TIR 79%, TAR 21%, TBR 0% with a GMI of 7.1%.    - Recent labs reviewed, stable stage 3 renal insufficiency.  POCT UM shows mild microalbuminuria, consistent with recent labs showing moderate kidney impairment.  She notes she was put back on Meloxicam for OA.  -her diabetes is complicated by coronary artery disease which required stent placement in 2015, renal insufficiency, obesity/sedentary life and SHAHED YEOMAN remains at a high risk for more acute and chronic complications which include CAD, CVA, CKD, retinopathy, and neuropathy. These are all discussed in detail with the patient.  - Nutritional counseling repeated at each appointment due to patients tendency to fall back in to old habits.  - The patient admits there is a room for improvement in their diet and drink choices. -  Suggestion is made for the patient to avoid simple carbohydrates from their diet including Cakes, Sweet Desserts / Pastries, Ice Cream, Soda (diet and regular), Sweet Tea, Candies, Chips, Cookies, Sweet Pastries, Store Bought Juices, Alcohol in Excess of 1-2 drinks a day, Artificial Sweeteners, Coffee Creamer, and Sugar-free Products. This will help patient to have stable  blood glucose profile and potentially avoid unintended weight gain.   - I encouraged the patient to switch to unprocessed or minimally processed complex starch and increased protein intake (animal or plant source), fruits, and vegetables.   - Patient is advised to stick to a routine mealtimes to eat 3 meals a day and avoid unnecessary snacks (to snack only to correct hypoglycemia).  - I have approached her with the following individualized plan to manage diabetes and patient agrees:   -Based on her recent symptoms, she is advised to stop the Mounjaro  for 2 weeks and then start back at lower dose of 7.5 mg SQ weekly.  If she needs extra help managing glucose during this time, may consider adding basal insulin .  The Mounjaro  has suppressed her appetite too much  and she is barely able to eat anything at all without getting sick.  We did discuss the importance of proper nutrition.    -She is encouraged to monitor blood glucose at least twice daily(using her CGM provided for her by her employer), before breakfast and before bed and report readings less than 70 or greater than 200 for 3 tests in a row.    - Patient is warned not to take insulin  without proper monitoring per orders.  2) BP/HTN:  Her blood pressure is controlled to target.  She is advised to continue Lisinopril  5 mg po daily and Bisoprolol 5 mg po daily.  3) Lipids/HPL:  Her recent lipid panel on 12/31/23 shows controlled LDL of 82 and elevated triglycerides of 249 (worsening).  She is advised to continue Atorvastatin  40 mg po daily at bedtime.  Side effects and precautions discussed with her.    4) Weight management- Her Body mass index is 33.05 kg/m.SABRA  She is a candidate for modest weight loss and continues to work hard towards this.   She has lost significant amount of weight recently, thus reduced her Mounjaro  so that she can tolerate eating more for nutrition purposes.  5) Vitamin D  Deficiency Her most recent vitamin D  level  from 01/09/23 was 73.5.  She continues on vitamin D  supplementation.   6) Chronic Care/Health Maintenance: -she is on ACEI/ARB and Statin medications and is encouraged to continue to follow up with Ophthalmology, Dentist, Podiatrist at least yearly or according to recommendations, and advised to stay away from smoking. I have recommended yearly flu vaccine and pneumonia vaccination at least every 5 years; moderate intensity exercise for up to 150 minutes weekly; and  sleep for at least 7 hours a day.  7) Thyroid nodule- incidental finding CT at [facility] incidentally noted thyroid nodule recommending US  to evaluate further.  I did order this today.  She denies any compressive symptoms.  I will call patient with results and next steps.  I also ordered baseline thyroid blood tests to assess thyroid hormone production.  - I advised patient to maintain close follow up with Baveja, Namrita, MD for primary care needs.     I spent  57  minutes in the care of the patient today including review of labs from CMP, Lipids, Thyroid Function, Hematology (current and previous including abstractions from other facilities); face-to-face time discussing  her blood glucose readings/logs, discussing hypoglycemia and hyperglycemia episodes and symptoms, medications doses, her options of short and long term treatment based on the latest standards of care / guidelines;  discussion about incorporating lifestyle medicine;  and documenting the encounter. Risk reduction counseling performed per USPSTF guidelines to reduce obesity and cardiovascular risk factors.     Please refer to Patient Instructions for Blood Glucose Monitoring and Insulin /Medications Dosing Guide  in media tab for additional information. Please  also refer to  Patient Self Inventory in the Media  tab for reviewed elements of pertinent patient history.  Avelina JONETTA Shutter participated in the discussions, expressed understanding, and voiced agreement with the  above plans.  All questions were answered to her satisfaction. she is encouraged to contact clinic should she have any questions or concerns prior to her return visit.    Follow up plan: - Return in about 3 months (around 11/05/2024) for Diabetes F/U with A1c in office, Previsit labs, thyroid ultrasound, Bring meter and logs.  [place]enton [place], [facility] [address] [place], [place] [address] Phone: [phone] Fax: [phone]  08/06/2024, 9:52 AM

## 2024-08-06 NOTE — Addendum Note (Signed)
 Addended by: KRYSTAL MADELIN HERO on: 08/06/2024 11:04 AM   Modules accepted: Orders

## 2024-08-14 ENCOUNTER — Ambulatory Visit (HOSPITAL_COMMUNITY)
Admission: RE | Admit: 2024-08-14 | Discharge: 2024-08-14 | Disposition: A | Discharge status: Home / Self Care | Source: Ambulatory Visit | Attending: Nurse Practitioner | Admitting: Nurse Practitioner

## 2024-08-14 DIAGNOSIS — E041 Nontoxic single thyroid nodule: Secondary | ICD-10-CM | POA: Insufficient documentation

## 2024-08-20 ENCOUNTER — Ambulatory Visit: Payer: Self-pay | Admitting: Nurse Practitioner

## 2024-08-20 NOTE — Progress Notes (Signed)
 Patient was called and we went over her results per [facility] message.

## 2024-09-07 NOTE — Telephone Encounter (Signed)
 Patient was called and a message was left with the results that Whitney had sent in MyChart.

## 2024-11-10 ENCOUNTER — Ambulatory Visit: Admitting: Nurse Practitioner

## 2024-11-10 LAB — COMPREHENSIVE METABOLIC PANEL WITH GFR
ALT: 14 IU/L (ref 0–32)
AST: 16 IU/L (ref 0–40)
Albumin: 4.2 g/dL (ref 3.9–4.9)
Alkaline Phosphatase: 153 IU/L — ABNORMAL HIGH (ref 49–135)
BUN/Creatinine Ratio: 17 (ref 12–28)
BUN: 17 mg/dL (ref 8–27)
Bilirubin Total: 0.7 mg/dL (ref 0.0–1.2)
CO2: 26 mmol/L (ref 20–29)
Calcium: 10.2 mg/dL (ref 8.7–10.3)
Chloride: 101 mmol/L (ref 96–106)
Creatinine, Ser: 1 mg/dL (ref 0.57–1.00)
Globulin, Total: 2.9 g/dL (ref 1.5–4.5)
Glucose: 131 mg/dL — ABNORMAL HIGH (ref 70–99)
Potassium: 4.6 mmol/L (ref 3.5–5.2)
Sodium: 140 mmol/L (ref 134–144)
Total Protein: 7.1 g/dL (ref 6.0–8.5)
eGFR: 61 mL/min/1.73 (ref >59)

## 2024-11-10 LAB — T4, FREE: Free T4: 1.25 ng/dL (ref 0.82–1.77)

## 2024-11-10 LAB — TSH: TSH: 1.05 u[IU]/mL (ref 0.450–4.500)

## 2024-11-12 ENCOUNTER — Ambulatory Visit: Admitting: Nurse Practitioner

## 2024-11-12 ENCOUNTER — Encounter: Payer: Self-pay | Admitting: Nurse Practitioner

## 2024-11-12 DIAGNOSIS — Z794 Long term (current) use of insulin: Secondary | ICD-10-CM | POA: Diagnosis not present

## 2024-11-12 DIAGNOSIS — I10 Essential (primary) hypertension: Secondary | ICD-10-CM | POA: Diagnosis not present

## 2024-11-12 DIAGNOSIS — Z7985 Long-term (current) use of injectable non-insulin antidiabetic drugs: Secondary | ICD-10-CM

## 2024-11-12 DIAGNOSIS — E041 Nontoxic single thyroid nodule: Secondary | ICD-10-CM

## 2024-11-12 DIAGNOSIS — E559 Vitamin D deficiency, unspecified: Secondary | ICD-10-CM

## 2024-11-12 DIAGNOSIS — E782 Mixed hyperlipidemia: Secondary | ICD-10-CM

## 2024-11-12 DIAGNOSIS — N1831 Chronic kidney disease, stage 3a: Secondary | ICD-10-CM

## 2024-11-12 DIAGNOSIS — E1122 Type 2 diabetes mellitus with diabetic chronic kidney disease: Secondary | ICD-10-CM

## 2024-11-12 LAB — POCT GLYCOSYLATED HEMOGLOBIN (HGB A1C): Hemoglobin A1C: 7 % — AB (ref 4.0–5.6)

## 2024-11-12 NOTE — Progress Notes (Signed)
 11/12/2024, 4:46 PM                 Endocrinology follow-up note   Subjective:    [name], female    DOB: 02-Apr-1956.  Alyssa Fox is being seen in follow-up  for management of currently uncontrolled symptomatic 2 diabetes, hyperlipidemia, hypertension, obesity. PMD:   Jacobo Gondola, MD.   Past Medical History:  Diagnosis Date   DM type 2 causing vascular disease (HCC)    Hypertension    MI (myocardial infarction) (HCC)    Past Surgical History:  Procedure Laterality Date   CHOLECYSTECTOMY     CORONARY ANGIOPLASTY WITH STENT PLACEMENT     TUBAL LIGATION     Social History   Socioeconomic History   Marital status: Widowed    Spouse name: Not on file   Number of children: Not on file   Years of education: Not on file   Highest education level: Not on file  Occupational History   Not on file  Tobacco Use   Smoking status: Never   Smokeless tobacco: Never  Vaping Use   Vaping status: Never Used  Substance and Sexual Activity   Alcohol use: No   Drug use: No   Sexual activity: Not on file  Other Topics Concern   Not on file  Social History Narrative   Not on file   Social Drivers of Health   Tobacco Use: Low Risk (11/12/2024)   Patient History    Smoking Tobacco Use: Never    Smokeless Tobacco Use: Never    Passive Exposure: Not on file  Financial Resource Strain: Not on file  Food Insecurity: Not on file  Transportation Needs: Not on file  Physical Activity: Not on file  Stress: Not on file  Social Connections: Not on file  Depression (EYV7-0): Not on file  Alcohol Screen: Not on file  Housing: Not on file  Utilities: Not on file  Health Literacy: Not on file   Outpatient Encounter Medications as of 11/12/2024  Medication Sig   allopurinol (ZYLOPRIM) 100 MG tablet every morning.   atorvastatin  (LIPITOR) 40 MG tablet TAKE 1 TABLET BY MOUTH  DAILY   bisoprolol (ZEBETA) 5 MG tablet Take  5 mg by mouth daily.   clopidogrel (PLAVIX) 75 MG tablet 1 tablet daily.   gabapentin  (NEURONTIN ) 100 MG capsule TAKE 1 CAPSULE BY MOUTH 3 TIMES  DAILY   lisinopril  (ZESTRIL ) 5 MG tablet TAKE 1 TABLET BY MOUTH  DAILY   nitroGLYCERIN (NITROSTAT) 0.4 MG SL tablet Place 0.4 mg under the tongue every 5 (five) minutes as needed for chest pain.   tirzepatide  (MOUNJARO ) 7.5 MG/0.5ML Pen Inject 7.5 mg into the skin once a week.   Vitamin D , Ergocalciferol , (DRISDOL ) 1.25 MG (50000 UNIT) CAPS capsule TAKE 1 CAPSULE BY MOUTH EVERY 7  DAYS   No facility-administered encounter medications on file as of 11/12/2024.    ALLERGIES: Allergies  Allergen Reactions   Latex Swelling   Tape Swelling    Other reaction(s): Unknown    VACCINATION STATUS:  There is no immunization history on file for this patient.  Diabetes She presents for her follow-up diabetic visit. She has type 2 diabetes mellitus. Onset time: She was diagnosed at approximate age of 34 years. Her disease course has been stable. There are no hypoglycemic associated symptoms. Pertinent negatives for hypoglycemia include no confusion, pallor or  seizures. Associated symptoms include weight loss. Pertinent negatives for diabetes include no polydipsia, no polyphagia and no polyuria. There are no hypoglycemic complications. Symptoms are stable. Diabetic complications include heart disease and peripheral neuropathy. (She is status post 3 stent placement for coronary artery disease and December 2015-about the time of her diagnosis with diabetes.) Risk factors for coronary artery disease include dyslipidemia, diabetes mellitus, family history, obesity, hypertension, post-menopausal and sedentary lifestyle. Current diabetic treatments: Mounjaro  only. She is compliant with treatment most of the time. Her weight is fluctuating minimally. She is following a generally healthy diet. When asked about meal planning, she reported none. She has not had a previous  visit with a dietitian. She rarely participates in exercise. Her home blood glucose trend is fluctuating minimally. Her overall blood glucose range is 140-180 mg/dl. (She presents today, accompanied by her daughter, with her CGM showing mostly at target glycemic profile overall.  Her POCT A1c today is 7%, improving from last visit of 7.2%.  She notes she stopped the Mounjaro  as recommended but ended up being off for well over 2 months due to trouble getting her new dose from the pharmacy.  She just restarted it about a month ago.  She has regained some weight.  She notes this has happened after starting the Mounjaro  back.  She notes her appetite is decreased but she is still eating.  Analysis of her CGM shows TIR 82%, TAR 18%, TBR 0% with a GMI of 7.1%.  She did have steroid injection not long ago and her sugars were elevated for the next several days.  ) An ACE inhibitor/angiotensin II receptor blocker is being taken. She does not see a podiatrist.Eye exam is current.    Review of systems  Constitutional: + increasing body weight,  current Body mass index is 35.05 kg/m. , no fatigue, no subjective hyperthermia, no subjective hypothermia, + decreased appetite Eyes: no blurry vision, no xerophthalmia ENT: no sore throat, no nodules palpated in throat, no dysphagia/odynophagia, no hoarseness Cardiovascular: no chest pain, no shortness of breath, no palpitations, no leg swelling Respiratory: no cough, no shortness of breath Gastrointestinal: no nausea/vomiting/diarrhea Musculoskeletal: no muscle/joint aches Skin: no rashes, no hyperemia Neurological: no tremors, no numbness, no tingling, no dizziness Psychiatric: no depression, no anxiety   Objective:    BP 112/62 (BP Location: Left Arm, Patient Position: Sitting, Cuff Size: Large)   Pulse 68   Ht 5' 5 (1.651 m)   Wt 210 lb 9.6 oz (95.5 kg)   BMI 35.05 kg/m   Wt Readings from Last 3 Encounters:  11/12/24 210 lb 9.6 oz (95.5 kg)  08/06/24  198 lb 9.6 oz (90.1 kg)  03/25/24 219 lb 12.8 oz (99.7 kg)      BP Readings from Last 3 Encounters:  11/12/24 112/62  08/06/24 100/62  03/25/24 104/68     Physical Exam- Limited  Constitutional:  Body mass index is 35.05 kg/m. , not in acute distress, normal state of mind Eyes:  EOMI, no exophthalmos Musculoskeletal: no gross deformities, strength intact in all four extremities, no gross restriction of joint movements Skin:  no rashes, no hyperemia Neurological: no tremor with outstretched hands   Diabetic Foot Exam - Simple   No data filed      Recent Results (from the past 2160 hours)  Comprehensive metabolic panel with GFR     Status: Abnormal   Collection Time: 11/09/24 12:11 PM  Result Value Ref Range   Glucose 131 (H) 70 - 99 mg/dL  BUN 17 8 - 27 mg/dL   Creatinine, Ser 8.99 0.57 - 1.00 mg/dL   eGFR 61 >40 fO/fpw/8.26   BUN/Creatinine Ratio 17 12 - 28   Sodium 140 134 - 144 mmol/L   Potassium 4.6 3.5 - 5.2 mmol/L   Chloride 101 96 - 106 mmol/L   CO2 26 20 - 29 mmol/L   Calcium  10.2 8.7 - 10.3 mg/dL   Total Protein 7.1 6.0 - 8.5 g/dL   Albumin 4.2 3.9 - 4.9 g/dL   Globulin, Total 2.9 1.5 - 4.5 g/dL   Bilirubin Total 0.7 0.0 - 1.2 mg/dL   Alkaline Phosphatase 153 (H) 49 - 135 IU/L   AST 16 0 - 40 IU/L   ALT 14 0 - 32 IU/L  TSH     Status: None   Collection Time: 11/09/24 12:11 PM  Result Value Ref Range   TSH 1.050 0.450 - 4.500 uIU/mL  T4, free     Status: None   Collection Time: 11/09/24 12:11 PM  Result Value Ref Range   Free T4 1.25 0.82 - 1.77 ng/dL  HgB J8r     Status: Abnormal   Collection Time: 11/12/24  4:12 PM  Result Value Ref Range   Hemoglobin A1C 7.0 (A) 4.0 - 5.6 %   HbA1c POC (<> result, manual entry)     HbA1c, POC (prediabetic range)     HbA1c, POC (controlled diabetic range)         Lipid Panel     Component Value Date/Time   CHOL 131 01/09/2023 0935   TRIG 153 (H) 01/09/2023 0935   HDL 35 (L) 01/09/2023 0935   CHOLHDL  3.7 01/09/2023 0935   CHOLHDL 4.8 04/06/2020 1132   LDLCALC 69 01/09/2023 0935   LDLCALC 86 04/06/2020 1132   LABVLDL 27 01/09/2023 0935      Assessment & Plan:   1) Type 2 diabetes mellitus with stage 3 chronic kidney disease, with long-term current use of insulin  (HCC)  - Alyssa Fox has currently uncontrolled symptomatic type 2 DM since 68 years of age.  She presents today, accompanied by her daughter, with her CGM showing mostly at target glycemic profile overall.  Her POCT A1c today is 7%, improving from last visit of 7.2%.  She notes she stopped the Mounjaro  as recommended but ended up being off for well over 2 months due to trouble getting her new dose from the pharmacy.  She just restarted it about a month ago.  She has regained some weight.  She notes this has happened after starting the Mounjaro  back.  She notes her appetite is decreased but she is still eating.  Analysis of her CGM shows TIR 82%, TAR 18%, TBR 0% with a GMI of 7.1%.  She did have steroid injection not long ago and her sugars were elevated for the next several days.    - Recent labs reviewed.  Kidney function appears to be improving some CKD stage 2 now.  -her diabetes is complicated by coronary artery disease which required stent placement in 2015, renal insufficiency, obesity/sedentary life and VLADA URIOSTEGUI remains at a high risk for more acute and chronic complications which include CAD, CVA, CKD, retinopathy, and neuropathy. These are all discussed in detail with the patient.  - Nutritional counseling repeated/built upon at each appointment.  - The patient admits there is a room for improvement in their diet and drink choices. -  Suggestion is made for the patient to avoid simple carbohydrates  from their diet including Cakes, Sweet Desserts / Pastries, Ice Cream, Soda (diet and regular), Sweet Tea, Candies, Chips, Cookies, Sweet Pastries, Store Bought Juices, Alcohol in Excess of 1-2 drinks a day,  Artificial Sweeteners, Coffee Creamer, and Sugar-free Products. This will help patient to have stable blood glucose profile and potentially avoid unintended weight gain.   - I encouraged the patient to switch to unprocessed or minimally processed complex starch and increased protein intake (animal or plant source), fruits, and vegetables.   - Patient is advised to stick to a routine mealtimes to eat 3 meals a day and avoid unnecessary snacks (to snack only to correct hypoglycemia).  - I have approached her with the following individualized plan to manage diabetes and patient agrees:   -She is advised to continue Mounjaro  7.5 mg SQ weekly.  -She is encouraged to monitor blood glucose at least twice daily (using her CGM provided for her by her employer), before breakfast and before bed and report readings less than 70 or greater than 200 for 3 tests in a row.    - Patient is warned not to take insulin  without proper monitoring per orders.  2) BP/HTN:  Her blood pressure is controlled to target.  She is advised to continue meds as prescribed by PCP.  3) Lipids/HPL:  Her recent lipid panel on 12/31/23 shows controlled LDL of 82 and elevated triglycerides of 249 (worsening).  She is advised to continue Atorvastatin  40 mg po daily at bedtime.  Side effects and precautions discussed with her.    4) Weight management- Her Body mass index is 35.05 kg/m.SABRA  She is a candidate for modest weight loss and continues to work hard towards this.   She has lost significant amount of weight recently, thus reduced her Mounjaro  so that she can tolerate eating more for nutrition purposes.  5) Vitamin D  Deficiency Her most recent vitamin D  level from 01/09/23 was 73.5.  She continues on vitamin D  supplementation.   6) Chronic Care/Health Maintenance: -she is on ACEI/ARB and Statin medications and is encouraged to continue to follow up with Ophthalmology, Dentist, Podiatrist at least yearly or according to  recommendations, and advised to stay away from smoking. I have recommended yearly flu vaccine and pneumonia vaccination at least every 5 years; moderate intensity exercise for up to 150 minutes weekly; and  sleep for at least 7 hours a day.  7) Thyroid  nodule- incidental finding CT at [facility] incidentally noted thyroid  nodule recommending US  to evaluate further.  Her thyroid  ultrasound showed nodule but it does appear to be at risk for cancer, thus she does not need additional imaging unless something new develops.  Her thyroid  labs were normal as well.  - I advised patient to maintain close follow up with Jacobo Gondola, MD for primary care needs.     I spent  49  minutes in the care of the patient today including review of labs from CMP, Lipids, Thyroid  Function, Hematology (current and previous including abstractions from other facilities); face-to-face time discussing  her blood glucose readings/logs, discussing hypoglycemia and hyperglycemia episodes and symptoms, medications doses, her options of short and long term treatment based on the latest standards of care / guidelines;  discussion about incorporating lifestyle medicine;  and documenting the encounter. Risk reduction counseling performed per USPSTF guidelines to reduce obesity and cardiovascular risk factors.     Please refer to Patient Instructions for Blood Glucose Monitoring and Insulin /Medications Dosing Guide  in media tab for additional information.  Please  also refer to  Patient Self Inventory in the Media  tab for reviewed elements of pertinent patient history.  Alyssa Fox participated in the discussions, expressed understanding, and voiced agreement with the above plans.  All questions were answered to her satisfaction. she is encouraged to contact clinic should she have any questions or concerns prior to her return visit.    Follow up plan: - Return in about 4 months (around 03/13/2025) for Diabetes F/U with A1c in  office, No previsit labs.  Benton Rio, [facility] [address] [place], [place] [address] Phone: [phone] Fax: [phone]  11/12/2024, 4:46 PM

## 2024-11-21 ENCOUNTER — Other Ambulatory Visit: Payer: Self-pay | Admitting: Nurse Practitioner

## 2024-12-11 ENCOUNTER — Ambulatory Visit (HOSPITAL_COMMUNITY): Payer: Self-pay | Admitting: Physician Assistant

## 2024-12-20 ENCOUNTER — Other Ambulatory Visit: Payer: Self-pay | Admitting: Nurse Practitioner

## 2024-12-20 DIAGNOSIS — E1122 Type 2 diabetes mellitus with diabetic chronic kidney disease: Secondary | ICD-10-CM

## 2024-12-31 ENCOUNTER — Ambulatory Visit: Admit: 2024-12-31 | Admitting: Orthopedic Surgery

## 2024-12-31 SURGERY — ANTERIOR CERVICAL DECOMPRESSION/DISCECTOMY FUSION 2 LEVEL/HARDWARE REMOVAL
Anesthesia: General

## 2025-01-21 ENCOUNTER — Ambulatory Visit (HOSPITAL_COMMUNITY): Admit: 2025-01-21 | Admitting: Orthopedic Surgery

## 2025-01-21 SURGERY — ANTERIOR CERVICAL DECOMPRESSION/DISCECTOMY FUSION 2 LEVELS
Anesthesia: General

## 2025-03-16 ENCOUNTER — Ambulatory Visit: Admitting: Nurse Practitioner
